# Patient Record
Sex: Female | Born: 1935 | Race: White | Hispanic: No | Smoking: Former smoker
Health system: Southern US, Community
[De-identification: ages and names within clinical notes are randomized; demographics above are authoritative.]

## PROBLEM LIST (undated history)

## (undated) DIAGNOSIS — R011 Cardiac murmur, unspecified: Secondary | ICD-10-CM

## (undated) DIAGNOSIS — K219 Gastro-esophageal reflux disease without esophagitis: Secondary | ICD-10-CM

## (undated) DIAGNOSIS — F329 Major depressive disorder, single episode, unspecified: Secondary | ICD-10-CM

## (undated) DIAGNOSIS — K859 Acute pancreatitis without necrosis or infection, unspecified: Secondary | ICD-10-CM

## (undated) DIAGNOSIS — M1990 Unspecified osteoarthritis, unspecified site: Secondary | ICD-10-CM

## (undated) DIAGNOSIS — I2510 Atherosclerotic heart disease of native coronary artery without angina pectoris: Secondary | ICD-10-CM

## (undated) DIAGNOSIS — R319 Hematuria, unspecified: Secondary | ICD-10-CM

## (undated) DIAGNOSIS — F32A Depression, unspecified: Secondary | ICD-10-CM

## (undated) DIAGNOSIS — G629 Polyneuropathy, unspecified: Secondary | ICD-10-CM

## (undated) DIAGNOSIS — R7303 Prediabetes: Secondary | ICD-10-CM

## (undated) DIAGNOSIS — R911 Solitary pulmonary nodule: Secondary | ICD-10-CM

## (undated) DIAGNOSIS — E785 Hyperlipidemia, unspecified: Secondary | ICD-10-CM

## (undated) DIAGNOSIS — I509 Heart failure, unspecified: Secondary | ICD-10-CM

## (undated) DIAGNOSIS — N289 Disorder of kidney and ureter, unspecified: Secondary | ICD-10-CM

## (undated) DIAGNOSIS — I10 Essential (primary) hypertension: Secondary | ICD-10-CM

## (undated) HISTORY — PX: ENDOSCOPIC RETROGRADE CHOLANGIOPANCREATOGRAPHY (ERCP) WITH PROPOFOL: SHX5810

## (undated) HISTORY — PX: CARDIAC CATHETERIZATION: SHX172

## (undated) HISTORY — PX: CORONARY ANGIOPLASTY: SHX604

## (undated) HISTORY — PX: HIP SURGERY: SHX245

## (undated) HISTORY — PX: OTHER SURGICAL HISTORY: SHX169

## (undated) HISTORY — PX: TOTAL KNEE ARTHROPLASTY: SHX125

## (undated) HISTORY — PX: BACK SURGERY: SHX140

## (undated) HISTORY — PX: ABDOMINAL SURGERY: SHX537

## (undated) HISTORY — PX: HIATAL HERNIA REPAIR: SHX195

## (undated) HISTORY — PX: CHOLECYSTECTOMY: SHX55

---

## 2007-04-22 ENCOUNTER — Ambulatory Visit: Payer: Self-pay | Admitting: Internal Medicine

## 2008-10-31 ENCOUNTER — Ambulatory Visit: Payer: Self-pay | Admitting: Internal Medicine

## 2010-10-15 ENCOUNTER — Ambulatory Visit: Payer: Self-pay | Admitting: Family Medicine

## 2011-03-23 ENCOUNTER — Emergency Department: Payer: Self-pay | Admitting: Emergency Medicine

## 2011-07-10 ENCOUNTER — Ambulatory Visit: Payer: Self-pay | Admitting: Family Medicine

## 2012-02-03 IMAGING — CT CT CHEST W/ CM
1 series · 15 of 31 positions shown, 19 images · IV contrast (isovue)
Comparison: none

REASON FOR EXAM: prev abnormal chest ct
COMMENTS:

PROCEDURE:     CT  - CT CHEST WITH CONTRAST  - July 10, 2011  [DATE]
RESULT:     Comparison: CT of the chest 03/23/2011
TECHNIQUE: Multiple axial images of the chest were obtained with 60 mL
Isovue 370 intravenous contrast.

[Series 2: soft tissue · axial · 0.68mm/px · z∈[+550,+856]mm · 15 of 67 slices shown, 19 images]
[im 3/67  mediastinal]
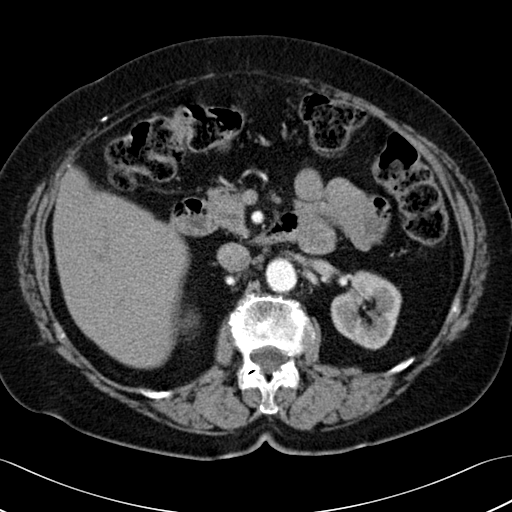
[im 3/67  lung]
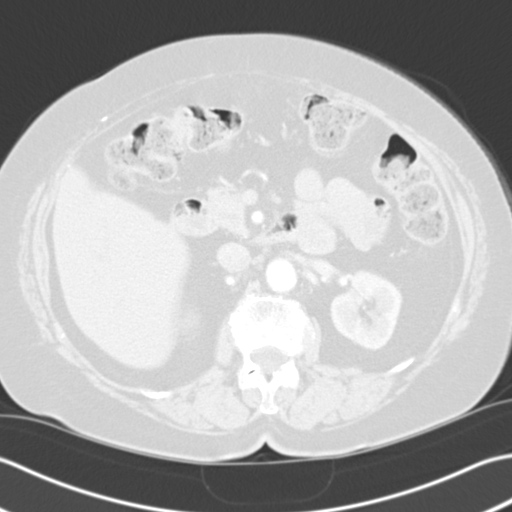
[im 8/67  lung]
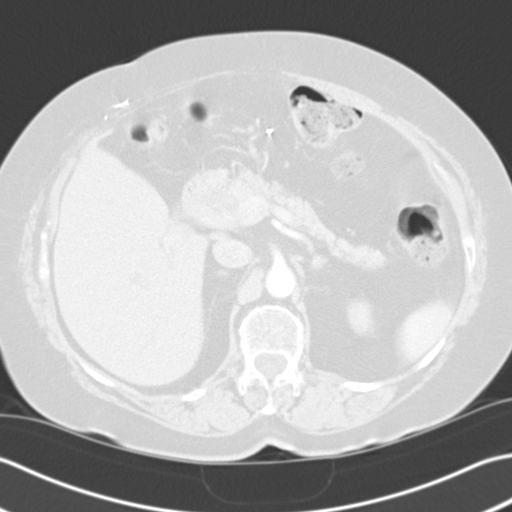
[im 13/67  lung]
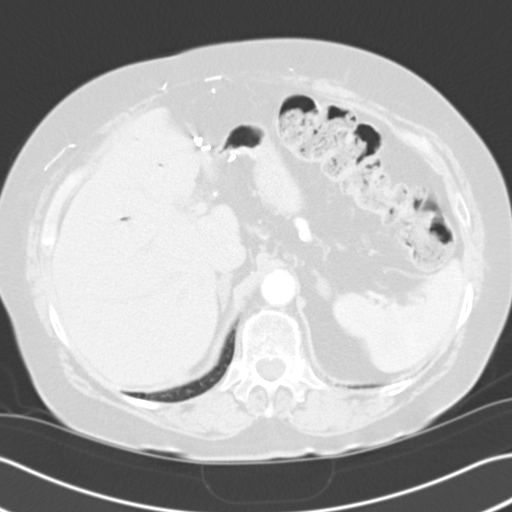
[im 15/67  lung]
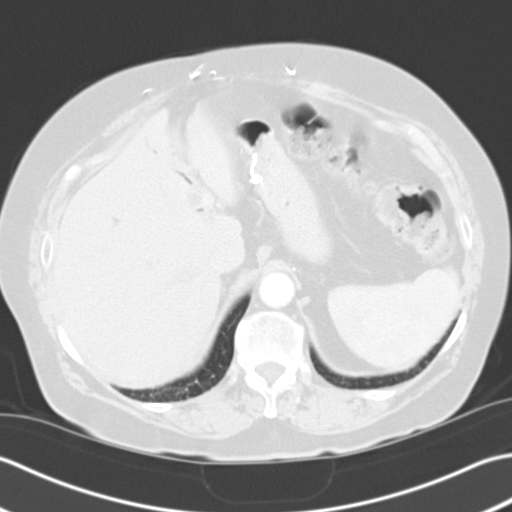
[im 20/67  mediastinal]
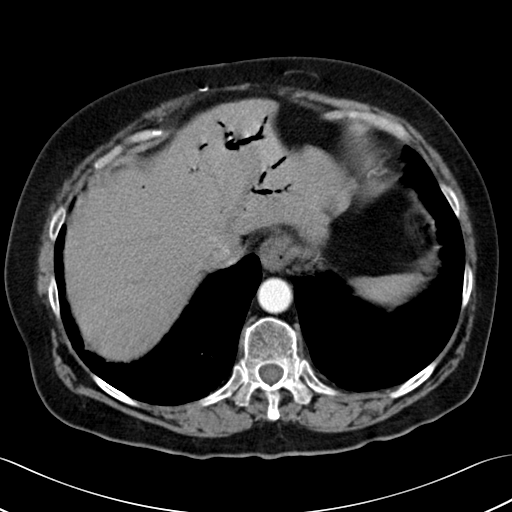
[im 20/67  lung]
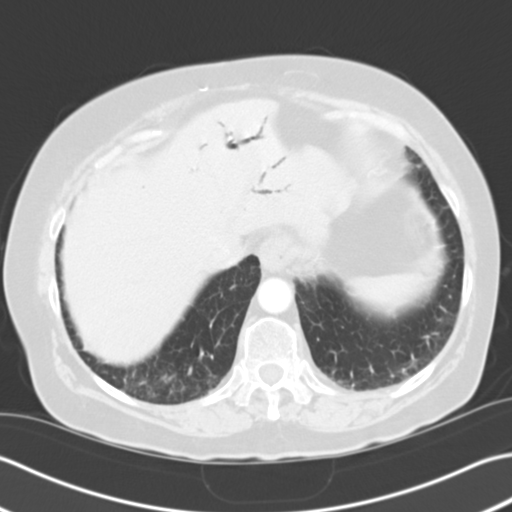
[im 25/67  lung]
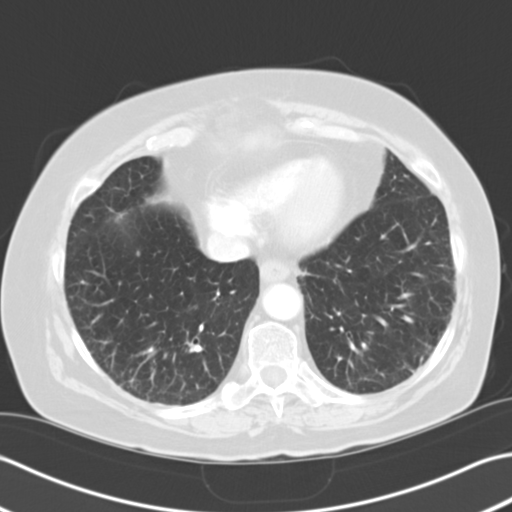
[im 30/67  lung]
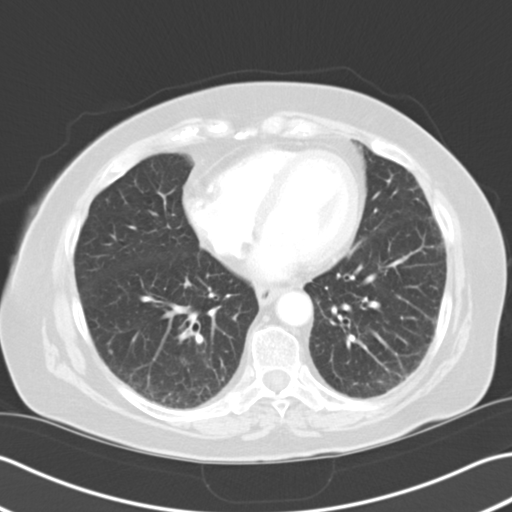
[im 35/67  lung]
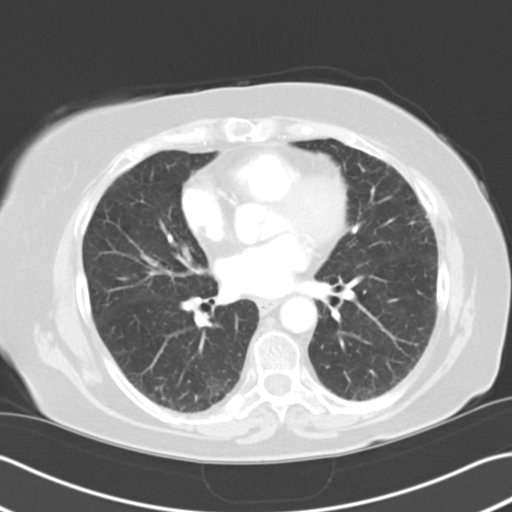
[im 37/67  mediastinal]
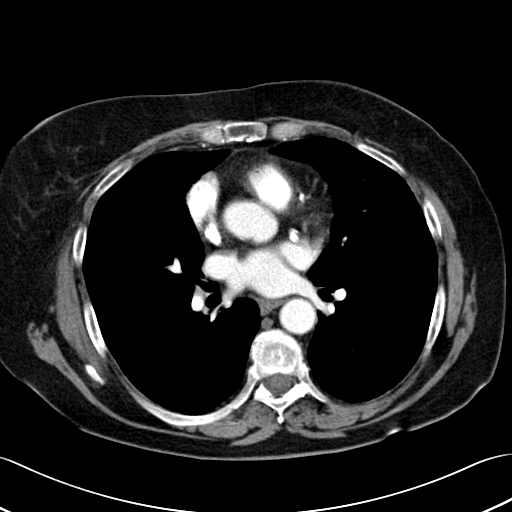
[im 37/67  lung]
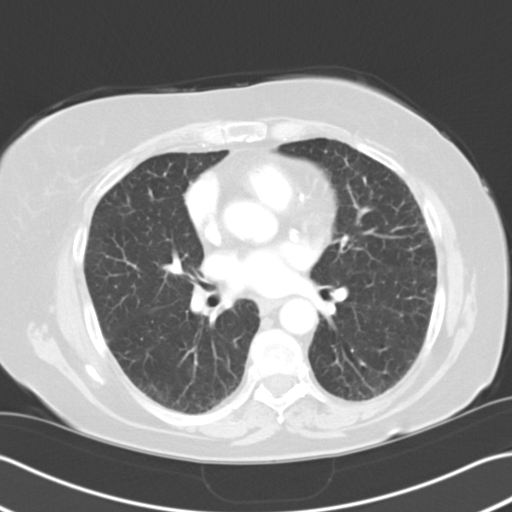
[im 42/67  lung]
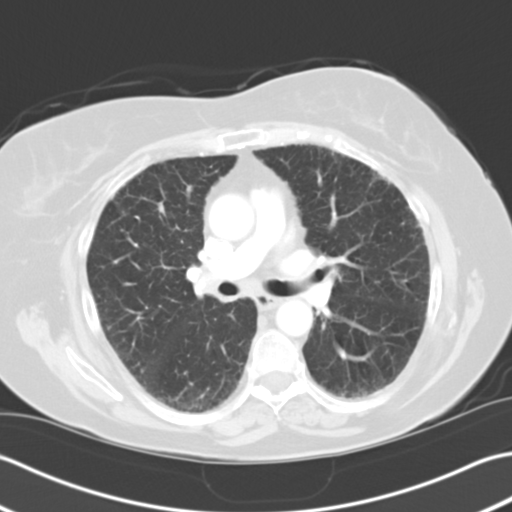
[im 47/67  lung]
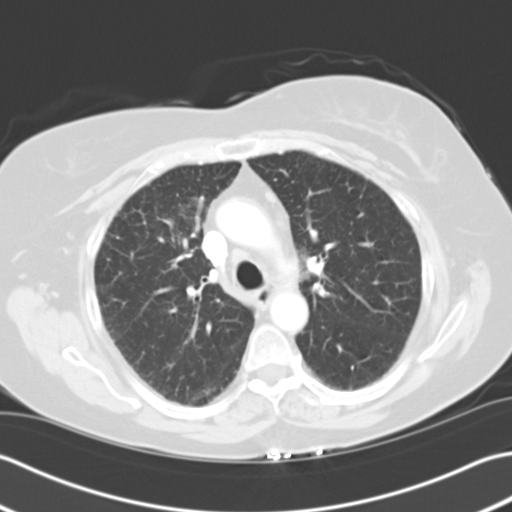
[im 52/67  lung]
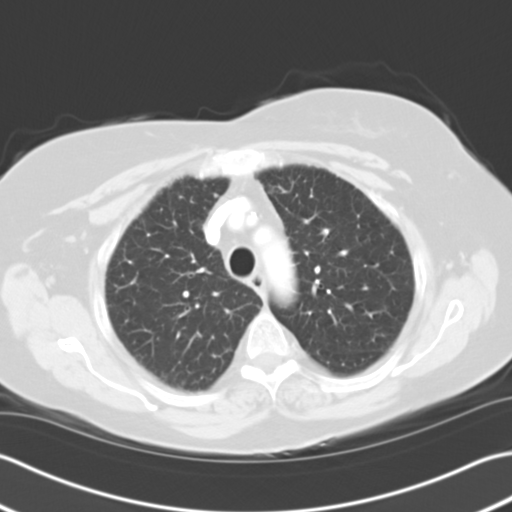
[im 54/67  mediastinal]
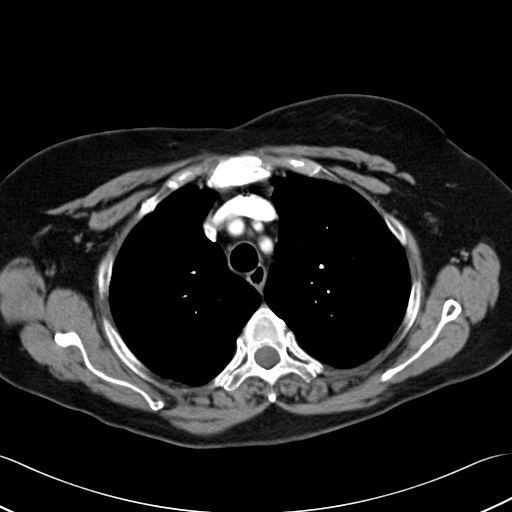
[im 54/67  lung]
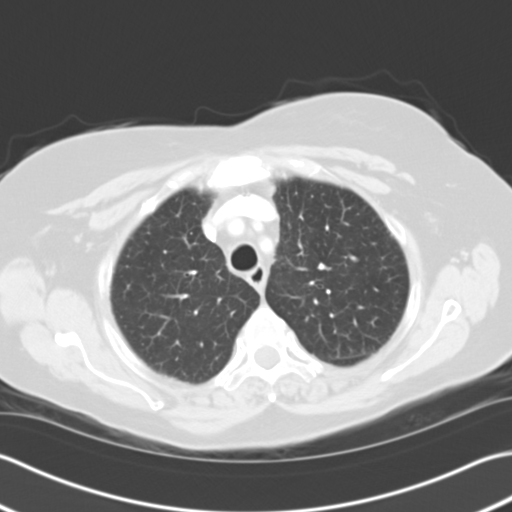
[im 59/67  lung]
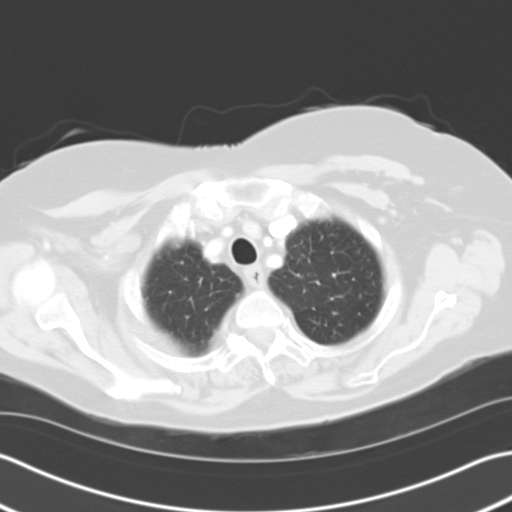
[im 64/67  lung]
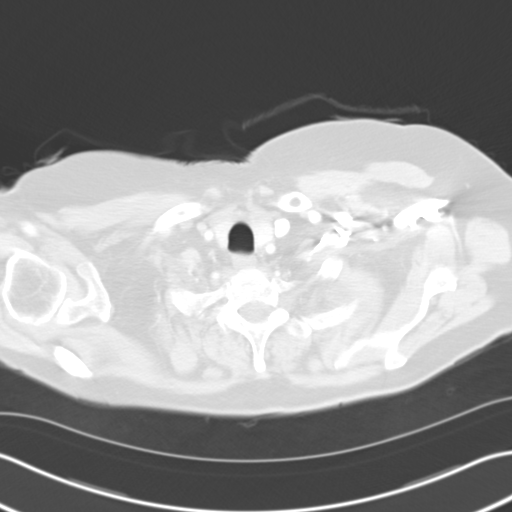

[15 of 31 positions shown; findings below may reference images not displayed]

FINDINGS: There is slight substernal extension of the left thyroid lobe. No
mediastinal, hilar, or axillary lymphadenopathy. Calcifications are seen in
the coronary arteries. There is pneumobilia in the left hepatic lobe and
common bile duct, likely related to prior sphincterotomy. The common bile
duct is mildly enlarged, similar to prior. Nodular thickening of the adrenal
glands is unchanged from prior studies, including the abdominal CT
10/15/2010.

The 8 mm nodule in the left upper lobe is unchanged from prior. Bilateral
subpleural interlobular septal thickening likely secondary to changes of
fibrosis. 4 mm nodule at the right lung base is unchanged from prior. A 4 mm
subpleural nodule in the right lower lobe is also unchanged on image 47.
Other 3-4 mm nodules are similar to prior.

No aggressive lytic or sclerotic osseous lesions identified.
IMPRESSION: 1. Unchanged 8 mm nodule in the left upper lobe. Followup noncontrast chest
CT is recommended in 3 months.
2. Other indeterminate subcentimeter pulmonary nodule are unchanged.
Recommend attention on followup chest CT.

## 2012-04-26 ENCOUNTER — Ambulatory Visit: Payer: Self-pay | Admitting: Emergency Medicine

## 2012-04-26 LAB — URINALYSIS, COMPLETE
Bilirubin,UR: NEGATIVE
Glucose,UR: NEGATIVE mg/dL (ref 0–75)
Ketone: NEGATIVE
Ph: 6.5 (ref 4.5–8.0)
Protein: NEGATIVE

## 2012-04-28 LAB — URINE CULTURE

## 2013-05-22 ENCOUNTER — Emergency Department: Payer: Self-pay | Admitting: Emergency Medicine

## 2014-12-21 ENCOUNTER — Observation Stay
Admit: 2014-12-21 | Disposition: A | Discharge status: Home / Self Care | Payer: Self-pay | Attending: Internal Medicine | Admitting: Internal Medicine

## 2014-12-21 LAB — URINALYSIS, COMPLETE
BACTERIA: NONE SEEN
Bilirubin,UR: NEGATIVE
Blood: NEGATIVE
GLUCOSE, UR: NEGATIVE mg/dL (ref 0–75)
Leukocyte Esterase: NEGATIVE
Nitrite: NEGATIVE
Ph: 6 (ref 4.5–8.0)
Protein: NEGATIVE
Specific Gravity: 1.017 (ref 1.003–1.030)

## 2014-12-21 LAB — COMPREHENSIVE METABOLIC PANEL
ALT: 10 U/L — AB
Albumin: 2.8 g/dL — ABNORMAL LOW
Alkaline Phosphatase: 66 U/L
Anion Gap: 7 (ref 7–16)
BUN: 14 mg/dL
Bilirubin,Total: 0.4 mg/dL
CHLORIDE: 108 mmol/L
CO2: 24 mmol/L
CREATININE: 0.68 mg/dL
Calcium, Total: 9.3 mg/dL
EGFR (Non-African Amer.): >60
GLUCOSE: 98 mg/dL
POTASSIUM: 3 mmol/L — AB
SGOT(AST): 22 U/L
SODIUM: 139 mmol/L
TOTAL PROTEIN: 5.7 g/dL — AB

## 2014-12-21 LAB — CBC
HCT: 33.4 % — ABNORMAL LOW (ref 35.0–47.0)
HGB: 11.1 g/dL — ABNORMAL LOW (ref 12.0–16.0)
MCH: 31.5 pg (ref 26.0–34.0)
MCHC: 33.3 g/dL (ref 32.0–36.0)
MCV: 95 fL (ref 80–100)
PLATELETS: 348 10*3/uL (ref 150–440)
RBC: 3.53 10*6/uL — AB (ref 3.80–5.20)
RDW: 15.1 % — AB (ref 11.5–14.5)
WBC: 8.8 10*3/uL (ref 3.6–11.0)

## 2014-12-21 LAB — LIPASE, BLOOD: Lipase: 49 U/L

## 2014-12-21 LAB — TROPONIN I: TROPONIN-I: 0.06 ng/mL — AB

## 2014-12-22 ENCOUNTER — Other Ambulatory Visit: Payer: Self-pay | Admitting: Physician Assistant

## 2014-12-22 DIAGNOSIS — I340 Nonrheumatic mitral (valve) insufficiency: Secondary | ICD-10-CM | POA: Diagnosis not present

## 2014-12-22 DIAGNOSIS — R112 Nausea with vomiting, unspecified: Secondary | ICD-10-CM | POA: Diagnosis not present

## 2014-12-22 DIAGNOSIS — R079 Chest pain, unspecified: Secondary | ICD-10-CM | POA: Diagnosis not present

## 2014-12-22 LAB — BASIC METABOLIC PANEL
Anion Gap: 2 — ABNORMAL LOW (ref 7–16)
BUN: 13 mg/dL
CO2: 25 mmol/L
Calcium, Total: 8.7 mg/dL — ABNORMAL LOW
Chloride: 112 mmol/L — ABNORMAL HIGH
Creatinine: 0.62 mg/dL
EGFR (African American): >60
GLUCOSE: 89 mg/dL
Potassium: 3.4 mmol/L — ABNORMAL LOW
Sodium: 139 mmol/L

## 2014-12-22 LAB — CBC WITH DIFFERENTIAL/PLATELET
Basophil #: 0 10*3/uL (ref 0.0–0.1)
Basophil %: 0.7 %
EOS ABS: 0.2 10*3/uL (ref 0.0–0.7)
EOS PCT: 2.5 %
HCT: 32.6 % — AB (ref 35.0–47.0)
HGB: 10.5 g/dL — AB (ref 12.0–16.0)
LYMPHS ABS: 2.5 10*3/uL (ref 1.0–3.6)
Lymphocyte %: 36 %
MCH: 30.9 pg (ref 26.0–34.0)
MCHC: 32.1 g/dL (ref 32.0–36.0)
MCV: 96 fL (ref 80–100)
MONO ABS: 0.7 x10 3/mm (ref 0.2–0.9)
Monocyte %: 10.2 %
NEUTROS PCT: 50.6 %
Neutrophil #: 3.5 10*3/uL (ref 1.4–6.5)
PLATELETS: 307 10*3/uL (ref 150–440)
RBC: 3.39 10*6/uL — AB (ref 3.80–5.20)
RDW: 15.2 % — AB (ref 11.5–14.5)
WBC: 6.9 10*3/uL (ref 3.6–11.0)

## 2014-12-22 LAB — LIPID PANEL
Cholesterol: 106 mg/dL
HDL Cholesterol: 33 mg/dL — ABNORMAL LOW
Ldl Cholesterol, Calc: 52 mg/dL
Triglycerides: 106 mg/dL
VLDL Cholesterol, Calc: 21 mg/dL

## 2014-12-22 LAB — MAGNESIUM: Magnesium: 1.8 mg/dL

## 2014-12-22 LAB — TSH: THYROID STIMULATING HORM: 1.016 u[IU]/mL

## 2014-12-22 LAB — TROPONIN I
TROPONIN-I: 0.05 ng/mL — AB
TROPONIN-I: 0.06 ng/mL — AB
Troponin-I: 0.04 ng/mL — ABNORMAL HIGH

## 2015-01-10 ENCOUNTER — Encounter: Payer: Self-pay | Admitting: Cardiovascular Disease

## 2015-01-14 NOTE — H&P (Signed)
PATIENT NAME:  Egbert GaribaldiHUSKETH, Ewelina MR#:  [id number] DATE OF BIRTH:  20-Nov-1926  DATE OF ADMISSION:  12/21/2014  REFERRING PHYSICIAN:  Cecille AmsterdamJonathan E. Mayford KnifeWilliams, MD  PRIMARY CARE PHYSICIAN:  [facility];  CHIEF COMPLAINT:  Nausea, vomiting, weakness.   HISTORY OF PRESENT ILLNESS:  An 81 year old Caucasian female with past medical history of chronic pancreatitis and COPD, non-O2 dependent, presenting with nausea, vomiting, and worsening weakness. She describes intermittent nausea and vomiting for about 2 months in total duration, worsening recently, described as nonbloody, nonbilious emesis. Denies abdominal pain, however, has poor p.o. intake and because of this is suffering with progressive weakness. Her symptoms were somewhat complicated whenever she fell and had a femur fracture about a month ago, which now she is in a rehabilitation facility. She denies any further symptoms other than generalized weakness, which has been progressive, and continuous nausea and vomiting slightly worsened.   REVIEW OF SYSTEMS: CONSTITUTIONAL:  Denies fevers or chills. Positive for fatigue and weakness.  EYES:  Denies blurred vision, double vision, or eye pain.  EARS, NOSE, AND THROAT:  Denies tinnitus, ear pain, or hearing loss.  RESPIRATORY:  Denies cough, wheeze, or shortness of breath.  CARDIOVASCULAR:  Denies chest pain, palpitations, or edema.  GASTROINTESTINAL:  Positive for nausea and vomiting as described above. Denies any diarrhea, constipation, or abdominal pain.  GENITOURINARY:  Denies dysuria or hematuria.  ENDOCRINE:  Denies nocturia or thyroid problems.  HEMATOLOGIC AND LYMPHATIC:  Denies easy bruising or bleeding.  SKIN:  Denies rashes or lesions.  MUSCULOSKELETAL:  Denies pain in the neck, back, shoulders, knees, or hips or arthritic symptoms.  NEUROLOGIC:  Denies paralysis or paresthesias.  PSYCHIATRIC:  Denies anxiety or depressive symptoms.   Otherwise, full review of systems performed  by me is negative.   PAST MEDICAL HISTORY:  Includes COPD, unspecified, non-O2 dependent; history of chronic pancreatitis.   SOCIAL HISTORY:  Uses a walker for ambulation. Currently in a rehabilitation facility. No alcohol, tobacco, or drug usage.   FAMILY HISTORY:  No known cardiovascular or pulmonary disorders.   ALLERGIES:  ATORVASTATIN, BETA BLOCKADES AS WELL AS CODEINE.   HOME MEDICATIONS:  Include oxycodone 5 mg p.o. q. 4 hours as needed for pain; Tylenol 325 mg p.o. 3 times a day as needed for pain or fever; lisinopril 10 mg p.o. daily; Nitrostat 0.4 mg sublingually daily as needed for chest pain; Lovenox 40 mg subcutaneously daily; fluoxetine 40 mg p.o. daily; trazodone 50 mg p.o. at bedtime as needed for sleep; promethazine 25 mg p.o. every 6 hours as needed for nausea and vomiting; pravastatin 40 mg p.o. at bedtime; alendronate 70 mg p.o. weekly on Sundays; Creon 6000, 19,000, and 30,000 units 3 capsules 3 times daily with meals; bisacodyl 5 mg 2 tablets daily as needed for constipation; MiraLax 17 grams daily; Senokot-S 50/8.6 mg 2 tabs p.o. b.i.d.; pantoprazole 40 mg p.o. daily; multivitamin 1 tablet p.o. daily; vitamin D2 at 50,000 international units p.o. q. weekly.   PHYSICAL EXAMINATION: VITAL SIGNS:  Temperature 99.2, heart rate 75, respirations 16, blood pressure 120/67, saturation 100% on room air. Weight is 69.9 kg, BMI 24.9.  GENERAL:  Chronically ill, frail-appearing Caucasian female currently in no acute distress.  HEAD:  Normocephalic, atraumatic.  EYES:  Pupils are equal, round, and reactive to light. Extraocular muscles are intact. No scleral icterus.  MOUTH:  Dry mucosal membrane. Dentition is poor. No abscess noted.  EARS, NOSE, AND THROAT:  Clear without exudates. No external lesions.  NECK:  Supple. No thyromegaly. No nodules. No JVD.  PULMONARY:  Clear to auscultation bilaterally without wheezes, rales, or rhonchi. No use of accessory muscles. Good respiratory  effort. CHEST:  Nontender to palpation.  CARDIOVASCULAR:  S1 and S2, regular rate and rhythm. No murmurs, rubs, or gallops. No edema. Pedal pulses are 2+ bilaterally.  GASTROINTESTINAL:  Soft, nontender, nondistended. No masses. Positive bowel sounds. No hepatosplenomegaly.  MUSCULOSKELETAL:  No swelling, clubbing, or edema. Range of motion is full in all extremities.  NEUROLOGIC:  Cranial nerves II through XII are intact. No gross focal neurological deficits. Sensation is intact. Reflexes are intact.  SKIN:  No ulceration, lesions, rashes, or cyanosis. Skin is warm and dry. Turgor is intact.  PSYCHIATRIC:  Mood and affect are within normal limits. The patient is awake, alert, and oriented x 3. Insight and judgment are intact.   LABORATORY DATA:  Sodium is 139, potassium 3, chloride 108, bicarbonate 24, BUN 14, creatinine 0.68, and glucose is 98. LFTs:  Total protein 5.7, albumin 2.8, ALT 10, otherwise LFTs within normal limits. Troponin is 0.06. WBC is 8.8, hemoglobin 11.4, and platelets are 348,000. Urinalysis is negative for evidence of infection. Chest x-ray performed, which reveals no acute cardiopulmonary process.   ASSESSMENT AND PLAN:  An 81 year old Caucasian female with history of chronic pancreatitis, presenting with nausea, vomiting, and weakness.   1.  Weakness, generalized. We will get physical therapy evaluation and provide IV fluid hydration.  2.  Elevated troponin without symptoms. Initiate aspirin and statin therapy. Place on telemetry. Trend cardiac enzymes x 3. She is on a prophylactic dose of Lovenox daily. If there is a significant upward trend in enzymes, we will initiate therapeutic dosing.  3.  Hypokalemia. Replace potassium, goal 4 to 5.  4.  Hypertension, essential. Continue with lisinopril.  5.  Venous thromboembolism prophylaxis with Lovenox.   CODE STATUS:  The patient is a full code.   TIME SPENT:  35 minutes.    ____________________________ Cletis Athens. Hower,  MD dkh:nb D: 12/21/2014 22:38:27 ET T: 12/21/2014 23:10:12 ET JOB#: [id number]  cc: Cletis Athens. Hower, MD, <Dictator> DAVID Synetta Shadow MD ELECTRONICALLY SIGNED 12/22/2014 3:53

## 2015-01-14 NOTE — Discharge Summary (Signed)
PATIENT NAME:  Tamara Rich, Tamara Rich MR#:  [id number] DATE OF BIRTH:  27-Jul-1927  DATE OF ADMISSION:  12/21/2014 DATE OF DISCHARGE:  12/22/2014  ADMITTING DIAGNOSES: Nausea and vomiting.   DISCHARGE DIAGNOSES: 1.  Intermittent nausea and vomiting, unclear etiology, rule out gastroparesis. 2.  Weight loss.  3.  Elevated troponin with normal echocardiogram. 4.  Hypokalemia.  5.  Generalized weakness.  6.  Right lower extremity pain after right femur fracture in February 2016.  7.  Intermittent chest pain.  8.  History of chronic obstructive pulmonary disease. 9.  Chronic pancreatitis.   DISCHARGE CONDITION: Stable.   DISCHARGE MEDICATIONS: The patient is to continue: 1.   Pravachol 40 mg p.o. at bedtime. 2.  Vitamin D2 50,000 units once weekly. 3.  Tylenol 325 mg 2 capsules 3 times daily as needed. 4.  Promethazine 25 mg every 6 hours as needed.  5.  Trazodone 50 mg p.o. at bedtime as needed. 6.  Oxycodone 5 mg every 4 hours as needed.  7.  Fluticasone nasal spray 2 sprays once daily in the morning.  8.  Nitrostat 0.4 mg sublingual every 5 minutes as needed.  9.  Alendronate 70 mg p.o. weekly. 10.  MiraLax 17 grams once daily. 11.  Pantoprazole 40 mg p.o. daily. 12.  Fluoxetine 40 mg p.o. daily. 13.  Lovenox 40 mg injectable solution inject once daily as previously ordered. 14.  Multivitamins once daily. 15.  Lidoderm 5% topical film once daily. 16.  Senokot S 50/8.6 two tablets twice daily. 17.  Creon 3 capsules 3 times daily with meals. 18.  Bisacodyl 5 mg 2 tablets once daily as needed.  19.  Lisinopril 5 mg p.o. daily. 20.  Promethazine 25 mg 3 times daily around the clock. 21.  Aspirin 81 mg p.o. daily; this is new medication.  22.  Magnesium oxide 400 mg p.o. daily. 23.  Tums 500 mg 2 to 3 times daily. 24.  Remeron 7.5 mg once at bedtime.   HOME OXYGEN: None.   DIET: 2 grams salt, low-fat, low-cholesterol, Boost Breeze 240 mL variety 3 times daily as tolerated. Diet  consistency: Mechanical soft. Advance to regular as tolerated.   ACTIVITY LIMITATIONS: As tolerated.   REFERRALS: To physical therapy.  DISCHARGE FOLLOWUP: 1.  Followup appointment with [facility], GI, in 2 days after discharge for intermittent nausea and vomiting as well as weight loss and possibly gastroparesis evaluation if EGD is done in the past with no significant abnormalities.  2.  [place] [facility] in 2 days after discharge for mildly elevated troponin. 3.  Followup with Dr. Jaci Lazier in 2 days after discharge.  CONSULTANTS: Care management, social work.   RADIOLOGIC STUDIES: Echocardiogram 8th of April 2016 revealing essentially normal study. Right ventricular ejection fraction by visual estimation 55% to 60%, normal global left ventricular systolic function, normal right ventricular size and systolic function, mild left ventricular hypertrophy, mild mitral valve regurgitation, mild tricuspid regurgitation, normal pulmonary arterial pressures.  Chest x-ray, portable single view, 7th of April 2016, showed no active disease.   HOSPITAL COURSE: The patient is an 81 year old Caucasian female with history of pancreatic insufficiency who presented to the hospital with complaints of nausea and vomiting as well as generalized weakness and weight loss. Please refer to Dr. Jarrett Soho admission note on the 7th of April 2016.   On arrival to the hospital, the patient's vital signs: Temperature was 99.2, pulse was 75, respiration rate 16, blood pressure 120/67, and saturation was 100% on room  air. Physical examination was unremarkable. The patient's oral mucosa was dry.   The patient's lab data done on arrival to the hospital showed low potassium level at 3, otherwise unremarkable study. Lipase level was normal at 49. The patient's liver enzymes revealed albumin level of 2.8, otherwise normal. Troponin was elevated at 0.6 on the first set, 0.06 on the second set, 0.05 on the third  set and 0.04 on the fourth. MB fraction as well as CK total were not checked. TSH was normal at 1.016. The patient's white blood cell count was normal at 8.8, hemoglobin was 11.1, and platelet count was 348,000. Urinalysis was unremarkable.   The patient was admitted to the hospital for further evaluation. She was hydrated. Her magnesium was checked and was found to be normal at 1.8. Her lipid panel was also checked and LDL was found to be 52, cholesterol level was 106, triglycerides 106, and HDL was 33. The patient was continued on antiemetics and her condition improved. She was able to eat for breakfast and documented 100% of food intake. She was not able to eat much for lunch, however. I discussed her case with dietary and dietary is to recommend Ensure Clear or Boost Breeze 3 times daily if the patient is able to tolerate. She was advised [facility] and discuss case with them and make decisions about further investigations including gastroparesis evaluation if needed provided EGDs were done in the past and no other pathology is found. The patient is to continue also nutritional supplements if tolerated.   In regards to elevated troponin, the patient had an echocardiogram done which was completely within normal limits. No wall motion abnormalities were found. It was felt the patient's elevated troponin very likely is demand ischemia and no further recommendations were made. However, the patient was recommended to start on aspirin therapy. No beta blockers were initiated due to relatively bradycardic heart rate. The patient is to follow up with cardiologist as outpatient and make decisions about further evaluation if needed.   In regards to hypokalemia, the patient's potassium level was supplemented. Magnesium level was normal at 1.8. Magnesium level was borderline normal and for this reason magnesium supplementation was recommended.   In regards to  generalized weakness, the patient is to continue physical therapy.   For her right lower extremity pains, due to right femur fracture in the past, the patient is to continue pain management.   For her chronic medical problems such as COPD and chronic pancreatitis she is to continue her outpatient medications. No changes were made.  DISCUSSION: The patient's intermittent nausea and vomiting could be caused by her medications including fluoxetine as well as trazodone combined. It would be beneficial to slowly taper these medications off. I am introducing low dose of Remeron to improve her appetite. The patient may benefit from Megace or dronabinol to improve her appetite. The patient would also benefit from calcium supplementation to improve her right femur healing.  The patient is being discharged in stable condition with the above-mentioned medications and follow-up. I discussed the patient's case with her daughter quite extensively. The patient's vital signs on the day of discharge: Temperature was 98.3, pulse was 58, respiratory rate was 16, blood pressure ranging from 100 systolic to 123 and 60s to 70s diastolic, and O2 sats were 96% to 98% on room air at rest.   TIME SPENT: 40 minutes.  ____________________________ Katharina Caperima Mario Coronado, MD rv:sb D: 12/22/2014 14:37:05 ET T:  12/22/2014 15:17:38 ET JOB#: [id number]  cc: Katharina Caper, MD, <Dictator> Dr. [facility]  Kameron Glazebrook MD ELECTRONICALLY SIGNED 12/24/2014 16:40

## 2015-01-14 NOTE — Consult Note (Signed)
General Aspect Primary Cardiologist: [facility] ______________  81 year old female with history of CAD s/p remote stenting to RCA and LCx in 2000, chronic pancreatitis, chronic abdominal pain, COPD not on home O2, previous left hip fracture, recent femur fracture, HTN, HLD, palpitations, bradycardia, depression, and peripheral neuropathy who preented to [facility] on 12/21/2014 with a 2 month history of intermittent nausea and vomiting after recently being admitted to [facility] 3/22-3/30 for the same in the setting of UTI at that time. Cardiology is consulted for elevated troponin peak of 0.06. _____________  PMH: 1. CAD s/p remote stenting to RCA and LCx in 2000 2. Chronic pancreatitis 3. Chronic abdominal pain 4. COPD not on home O2 5. Previous left hip fracture 6. Recent femur fracture 7. HTN 8. HLD 9. Palpitations 10. Bradycardia 11. Depression 12. Peripheral neuropathy 13. Choledocholithiasis s/p stone extraction via ERCP 09/2014 _________________________   Present Illness 81 year old female with the above complex problem list who presented to [facility] on 4/7 with 2 month history of intermittent nausea and vomiting after recent admission to [facility] for the same on 3/22-3/30.   She has known CAD s/p PCI to LCx and RCA in 2000.  Follow up cardiac cath in 03/2010 secondary to atypical chest pain that showed LM normal. Prox LAD 20%, mid LAD 70%, distal LAD 40%, LCx and mid LCx 50%, distal LCx 20%, third marginal 30%, mRCA 30%, dRCA 20%. Based on atypical chest pain, negative recent nuclear study patient defined as having borderline disease without really any significant disease. She was treated medically. In 12/25/2010 she had an episode of chest heaviness, jaw and neck pain. She underwent pharmacologic nuclear stress on 01/08/2011 that showed ischemia noted, base inferior, base inferolateral, mid anteroseptal, mid inferior, mid inferolateral, apical anterior and apical inferior walls. Basaed on this she underwent  cardiac catheterization on 01/14/2011 that showed nonobstructive coronary artery disease with pRCA 30%, dRCA 30%, mLCx 40%, OM2 30%, pLAD 20%, mLAD 30%, D2 50%. She again complained of intermittent chest pain in 02/2012 and underwent cardiac cath that showed LAD 20%, D2 70%. LCX 20%. RCA 30%. C/b right groin hematoma.   She has had "abdominal issues" since she was a child. She mentions a longstanding history of vomiting her whole life. She has been followed by [facility] for this and has been placed on PPI for at least the past 5 years. [facility] has told her they have done everything for her and she must now treat her symptoms. Her daughter continues to want her to be "fixed." Unfortunately, she refuses to take this at times. She has poor PO intake. She states she is ready to die. Her daughters are not ready for this and want her to do everything she can to stay with them.   She was recently admitted to [facility] 3/22-3/30 with Enterococcus UTI, nausea, and vomiting. She was hydrated and started on stool softener for constipation which has helped to the point of diarrhea. She was treated with IV vancomycin for her infection. She continues to exhibit the same symptoms as her previous admission, though improved.   She reports an intermittent history of nausea and vomiting for the past 2 months. She denies any chest pain during this time, during this admission, or currently. She has not had any SOB, palpitaitons, diaphoresis, presyncope, or syncope. Her nausea and vomiting have improved. Her appetite has improved. She reports that she feels better. Her troponin showed 0.06-->0.06-->0.05-->0.04. CXR negative. TSH normal. Albumin 2.8. Echo showed EF 55-60%, mild  MR/TR, normal PASP.   Physical Exam:  GEN well developed, no acute distress   HEENT PERRL, hearing intact to voice, moist oral mucosa   NECK supple  no JVD   RESP normal resp effort  clear BS   CARD Regular rate and rhythm  Murmur   Murmur Systolic  1/6 apex    ABD denies tenderness  soft   LYMPH negative neck   EXTR negative edema   SKIN normal to palpation   NEURO cranial nerves intact, motor/sensory function intact   PSYCH alert, A+O to time, place, person, good insight   Review of Systems:  Subjective/Chief Complaint Nausea, resolved, anorexia   General: Fatigue  Weakness   Skin: No Complaints   ENT: No Complaints   Eyes: No Complaints   Neck: No Complaints   Respiratory: No Complaints   Cardiovascular: No Complaints   Gastrointestinal: Heartburn  Nausea   Genitourinary: No Complaints   Vascular: No Complaints   Musculoskeletal: No Complaints   Neurologic: No Complaints   Hematologic: No Complaints   Endocrine: No Complaints   Psychiatric: No Complaints   Review of Systems: All other systems were reviewed and found to be negative   Medications/Allergies Reviewed Medications/Allergies reviewed   Family & Social History:  Family and Social History:  Family History Coronary Artery Disease  Hypertension  Diabetes Mellitus  COPD  Cancer   Social History positive  tobacco, negative ETOH, negative Illicit drugs   + Tobacco Prior (greater than 1 year)  1/4 ppd for 40 years quit 2010   Place of Living Home     cad s/p stenting:    Kidney Cancer:    copd:    Left Hip Replacement:    Bilateral Knee Replacement:    hysterectomy:    cholecystectomy:   Home Medications: Medication Instructions Status  Remeron 15 mg oral tablet 0.5 tab(s) orally once a day (at bedtime) Active  promethazine 25 mg oral tablet 1 tab(s) orally 3 times a day Active  Tums 500 mg oral tablet, chewable 1 tab(s) orally 3 times a day Active  aspirin 81 mg oral delayed release tablet 1 tab(s) orally once a day Active  lisinopril 5 mg oral tablet 1 tab(s) orally once a day Active  magnesium oxide 400 mg (241.3 mg elemental magnesium) oral tablet 1 tab(s) orally once a day Active  pravastatin 40 mg oral tablet 1 tab(s) orally  once a day (at bedtime) Active  Vitamin D2 50,000 intl units oral capsule 1 cap(s) orally once a week (on Saturday) Active  Tylenol 325 mg oral tablet 3 tab(s) orally 3 times a day, As Needed - for Pain, for Fever  Active  promethazine 25 mg oral tablet 1 tab(s) orally every 6 hours, As Needed - for Nausea, Vomiting Active  traZODone 50 mg oral tablet 1 tab(s) orally once a day (at bedtime), As Needed - for Inability to Sleep Active  oxyCODONE 5 mg oral tablet 1 tab(s) orally every 4 hours, As Needed - for Pain Active  fluticasone nasal 50 mcg/inh nasal spray 2 spray(s) nasal once a day (in the morning) Active  Nitrostat 0.4 mg sublingual tablet 1 tab(s) sublingual every 5 minutes, As Needed - for Chest Pain Active  alendronate 70 mg oral tablet 1 tab(s) orally once a week (on Sunday) Active  MiraLax - oral powder for reconstitution 17 gram(s) orally once a day (in the morning) Active  pantoprazole 40 mg oral delayed release tablet 1 tab(s) orally once a day (  in the morning) Active  FLUoxetine 40 mg oral capsule 1 cap(s) orally once a day (in the morning) Active  Lovenox 40 mg/0.4 mL injectable solution 0.4 milliliter(s) injectable once a day (in the morning) Active  Therems Therapeutic Multiple Vitamins oral tablet 1 tab(s) orally once a day (in the morning) Active  Lidoderm 5% topical film Apply topically to affected area once a day (in the morning) Active  Senokot S 50 mg-8.6 mg oral tablet 2 tab(s) orally 2 times a day Active  Creon 6000 units-19,000 units-30,000 units oral delayed release capsule 3 cap(s) orally 3 times a day (with meals) Active  bisacodyl 5 mg oral delayed release tablet 2 tab(s) orally once a day, As Needed - for Constipation Active   Lab Results:  Hepatic:  07-Apr-16 20:02   Albumin, Serum  2.8 (3.5-5.0 NOTE: New reference range  11/21/14)  Routine Chem:  08-Apr-16 05:13   Result Comment - TROPONIN  - PREVIOUSLY CALLED  - 12-21-14 @2100  BY [facility]...AJO  Result(s)  reported on 22 Dec 2014 at 06:14AM.  Glucose, Serum 89 (65-99 NOTE: New Reference Range  11/21/14)  BUN 13 (6-20 NOTE: New Reference Range  11/21/14)  Creatinine (comp) 0.62 (0.44-1.00 NOTE: New Reference Range  11/21/14)  Sodium, Serum 139 (135-145 NOTE: New Reference Range  11/21/14)  Potassium, Serum  3.4 (3.5-5.1 NOTE: New Reference Range  11/21/14)  Chloride, Serum  112 (101-111 NOTE: New Reference Range  11/21/14)  CO2, Serum 25 (22-32 NOTE: New Reference Range  11/21/14)  Calcium (Total), Serum  8.7 (8.9-10.3 NOTE: New Reference Range  11/21/14)  Anion Gap  2  eGFR (African American) >60  eGFR (Non-African American) >60 (eGFR values <28m/min/1.73 m2 may be an indication of chronic kidney disease (CKD). Calculated eGFR is useful in patients with stable renal function. The eGFR calculation will not be reliable in acutely ill patients when serum creatinine is changing rapidly. It is not useful in patients on dialysis. The eGFR calculation may not be applicable to patients at the low and high extremes of body sizes, pregnant women, and vegetarians.)    05:34   Magnesium, Serum 1.8 (1.7-2.4 THERAPEUTIC RANGE: 4-7 mg/dL TOXIC: > 10 mg/dL  ----------------------- NOTE: New Reference Range  11/21/14)  Cardiac:  07-Apr-16 20:02   Troponin I  0.06 (0.00-0.03 0.03 ng/mL or less: NEGATIVE  Repeat testing in 3-6 hrs  if clinically indicated. >0.05 ng/mL: POTENTIAL  MYOCARDIAL INJURY. Repeat  testing in 3-6 hrs if  clinically indicated. NOTE: An increase or decrease  of 30% or more on serial  testing suggests a  clinically important change NOTE: New Reference Range  11/21/14)  08-Apr-16 00:15   Troponin I  0.06 (0.00-0.03 0.03 ng/mL or less: NEGATIVE  Repeat testing in 3-6 hrs  if clinically indicated. >0.05 ng/mL: POTENTIAL  MYOCARDIAL INJURY. Repeat  testing in 3-6 hrs if  clinically indicated. NOTE: An increase or decrease  of 30% or more on serial   testing suggests a  clinically important change NOTE: New Reference Range  11/21/14)    05:13   Troponin I  0.05 (0.00-0.03 0.03 ng/mL or less: NEGATIVE  Repeat testing in 3-6 hrs  if clinically indicated. >0.05 ng/mL: POTENTIAL  MYOCARDIAL INJURY. Repeat  testing in 3-6 hrs if  clinically indicated. NOTE: An increase or decrease  of 30% or more on serial  testing suggests a  clinically important change NOTE: New Reference Range  11/21/14)    08:45   Troponin I  0.04 (0.00-0.03 0.03  ng/mL or less: NEGATIVE  Repeat testing in 3-6 hrs  if clinically indicated. >0.05 ng/mL: POTENTIAL  MYOCARDIAL INJURY. Repeat  testing in 3-6 hrs if  clinically indicated. NOTE: An increase or decrease  of 30% or more on serial  testing suggests a  clinically important change NOTE: New Reference Range  11/21/14)  Routine UA:  07-Apr-16 21:52   Color (UA) Yellow  Clarity (UA) Clear  Glucose (UA) Negative  Bilirubin (UA) Negative  Ketones (UA) Trace  Specific Gravity (UA) 1.017  Blood (UA) Negative  pH (UA) 6.0  Protein (UA) Negative  Nitrite (UA) Negative  Leukocyte Esterase (UA) Negative (Result(s) reported on 21 Dec 2014 at 10:22PM.)  RBC (UA) 0-5  WBC (UA) 0-5  Bacteria (UA) NONE SEEN  Epithelial Cells (UA) 0-5  Mucous (UA) PRESENT (Result(s) reported on 21 Dec 2014 at 10:22PM.)  Routine Hem:  08-Apr-16 05:13   WBC (CBC) 6.9  RBC (CBC)  3.39  Hemoglobin (CBC)  10.5  Hematocrit (CBC)  32.6  Platelet Count (CBC) 307  MCV 96  MCH 30.9  MCHC 32.1  RDW  15.2  Neutrophil % 50.6  Lymphocyte % 36.0  Monocyte % 10.2  Eosinophil % 2.5  Basophil % 0.7  Neutrophil # 3.5  Lymphocyte # 2.5  Monocyte # 0.7  Eosinophil # 0.2  Basophil # 0.0 (Result(s) reported on 22 Dec 2014 at 05:49AM.)   EKG:  EKG Interp. by me   Interpretation EKG shows NSR, 76 bpm, left anterior fascicular block, left axis deviation, TWI I and V6   Radiology Results:  XRay:    07-Apr-16 21:35, Chest  Portable Single View  Chest Portable Single View   REASON FOR EXAM:    chest pain, cough  COMMENTS:       PROCEDURE: DXR - DXR PORTABLE CHEST SINGLE VIEW  - Dec 21 2014  9:35PM     CLINICAL DATA:  Nausea, vomiting, diarrhea, cough, and fever today.  History of COPD.    EXAM:  PORTABLE CHEST - 1 VIEW    COMPARISON:  CT chest 07/10/2011.  Chest 03/23/2011.    FINDINGS:  Mild diffuse interstitial pattern to the lungs suggesting fibrosis.  No change since previous study. Normal heart size and pulmonary  vascularity. No focal airspace disease or consolidation in the  lungs. No blunting of costophrenic angles. No pneumothorax.  Mediastinal contours appear intact.     IMPRESSION:  No active disease.      Electronically Signed    By: Lucienne Capers M.D.    On: 12/21/2014 21:49         Verified By: Neale Burly, M.D.,  Cardiology:    08-Apr-16 12:54, Echo Doppler  Echo Doppler   REASON FOR EXAM:      COMMENTS:       PROCEDURE: [facility] - ECHO DOPPLER COMPLETE(TRANSTHOR)  - Dec 22 2014 12:54PM     RESULT: Echocardiogram Report    Patient Name:   LASHAUNTA SICARD Date of Exam: 12/22/2014  Medical Rec #:  [id number]         Custom1:  Date of Birth:  02-Aug-1927      Height:  Patient Age:    76 years       Weight:  Patient Gender: F              BSA:    Indications: MI  Sonographer:    Sherrie Sport RDCS  Referring Phys: Valentino Nose, K    Summary:  1. Essentially a normal study   2. Left ventricular ejection fraction, by visual estimation, is 55 to   60%.   3. Normal global left ventricular systolic function.   4. Normal right ventricular size and systolic function.   5. Mild left ventricular hypertrophy.   6. Mild mitral valve regurgitation.   7. Mild tricuspid regurgitation.   8. Normal pulmonary artery pressures  2D AND M-MODE MEASUREMENTS (normal ranges within parentheses):  Left Ventricle:          Normal  IVSd (2D):      1.23 cm (0.7-1.1)  LVPWd (2D):     1.36 cm  (0.7-1.1) Aorta/LA:                  Normal  LVIDd (2D):     3.75 cm (3.4-5.7) Aortic Root (2D): 2.50 cm (2.4-3.7)  LVIDs (2D):     2.70 cm           Left Atrium (2D): 2.80 cm (1.9-4.0)  LV FS (2D):     28.0 %   (>25%)  LV EF (2D):     55.0 %   (>50%)                        Right Ventricle:                                    RVd (2D):        2.68 cm  LV DIASTOLIC FUNCTION:  MV Peak E: 0.86 m/s E/e' Ratio: 15.20  MV Peak A: 0.84 m/s Decel Time: 352 msec  E/A Ratio: 1.02  SPECTRAL DOPPLER ANALYSIS (where applicable):  Mitral Valve:  MV P1/2 Time: 102.08 msec  MV Area, PHT: 2.16 cm??  Aortic Valve: AoV Max Vel: 1.52 m/s AoV Peak PG: 9.2 mmHg AoV Mean PG:  LVOT Vmax: 1.02 m/s LVOT VTI:  LVOT Diameter: 2.00 cm  AoV Area, Vmax: 2.11 cm?? AoV Area, VTI: AoV Area, Vmn:  Tricuspid Valve and PA/RV Systolic Pressure: TR Max Velocity: 2.46 m/s RA   Pressure: 5 mmHg RVSP/PASP: 29.1 mmHg  Pulmonic Valve:  PV Max Velocity: 1.10 m/s PV Max PG: 4.8 mmHg PV Mean PG:    PHYSICIAN INTERPRETATION:  Left Ventricle: The left ventricular internal cavity size was normal. LV   posterior wall thickness was normal. Mild left ventricular hypertrophy.   Global LV systolic function was normal. Left ventricular ejection   fraction, by visual estimation, is 55 to 60%. Spectral Doppler shows   normal pattern of LV diastolic filling.  Right Ventricle: Normal right ventricular size, wall thickness, and     systolic function. The right ventricular size is normal. Global RV   systolic function is normal.  Left Atrium: The left atrium is normal in size.  Right Atrium: The right atrium is normal in size.  Pericardium: There is no evidence of pericardial effusion.  Mitral Valve: The mitral valve is normal in structure. Mild mitral valve   regurgitation is seen.  Tricuspid Valve: The tricuspid valve is normal. Mild tricuspid   regurgitation is visualized. The tricuspid regurgitant velocity is 2.46   m/s, and with an  assumed right atrial pressure of 5 mmHg, the estimated   right ventricular systolic pressure is normalat 29.1 mmHg.  Aortic Valve: The aortic valve was not well seen. Mild aortic valve   sclerosis is present, with no evidence of  aortic valve stenosis. No   evidence of aortic valve regurgitation is seen.  Pulmonic Valve: The pulmonic valve is normal. Trace pulmonic valve     regurgitation.  Aorta: The aortic root and ascending aorta are structurally normal, with   no evidence of dilitation.    [id number] Ida Rogue MD  Electronically signed by [id number] Ida Rogue MD  Signature Date/Time: 12/22/2014/2:07:55 PM    *** Final ***    IMPRESSION: .        Verified By: Minna Merritts, M.D., MD    Codeine: N/V  atorvastatin: Unknown  Beta Blockers: Unknown  Vital Signs/Nurse's Notes: **Vital Signs.:   08-Apr-16 07:37  Vital Signs Type Q 8hr  Temperature Temperature (F) 98.3  Celsius 36.8  Temperature Source oral  Pulse Pulse 58  Respirations Respirations 16  Systolic BP Systolic BP 354  Diastolic BP (mmHg) Diastolic BP (mmHg) 63  Mean BP 75  Pulse Ox % Pulse Ox % 96  Pulse Ox Activity Level  At rest  Oxygen Delivery Room Air/ 21 %    Impression 80 year old female with history of CAD s/p remote stenting to RCA and LCx in 2000, chronic pancreatitis, chronic abdominal pain, COPD not on home O2, previous left hip fracture, recent femur fracture, HTN, HLD, palpitations, bradycardia, depression, and peripheral neuropathy who preented to [facility] on 12/21/2014 with a 2 month history of intermittent nausea and vomiting after recently being admitted to [facility] 3/22-3/30 for the same in the setting of UTI at that time. Cardiology is consulted for elevated troponin peak of 0.06.  1. Elevated troponin: -Likely supply demand ischemia in the setting of her nausea, vomiting, and decreased po appetite over an extended time period - denies any anginal symptoms, should she develop any symptoms  concerning for angina can follow up with [facility] or [place] -Multiple cardiac caths since 2000, most recently in 2013, that has been treated medically by primary cardiologist  -Echo showed showed LV function -Continue aspirin, statin, and b-blocker as BP allows outpt follow up  2. Intermittent nausea and vomiting/chronic pancreatits/generalized weakness: -Followed by [facility], recent admission for same 3/22-3/30 -Improved -Has been Rx'd Protonix by [facility], which helps, though she refuses to take at times -Continue Protonix per primary treating team -Could add Remeron vs Megace to stimulate appetite  3. Recent femur fracture: -PT -Will be discharged back to rehab  4. Hypokalemia: -Replete to 4.0  5. COPD: -No exacerbation   6. Anemia: -Likely chronic -Follow up with PCP   Electronic Signatures: Rise Mu (PA-C)  (Signed 08-Apr-16 14:51)  Authored: General Aspect/Present Illness, History and Physical Exam, Review of System, Family & Social History, Past Medical History, Home Medications, Labs, EKG , Radiology, Allergies, Vital Signs/Nurse's Notes, Impression/Plan Ida Rogue (MD)  (Signed 09-Apr-16 10:12)  Authored: General Aspect/Present Illness, History and Physical Exam, Review of System, Family & Social History, EKG , Radiology, Vital Signs/Nurse's Notes, Impression/Plan  Co-Signer: General Aspect/Present Illness, History and Physical Exam, Review of System, Family & Social History, Past Medical History, Home Medications, Labs, EKG , Radiology, Allergies, Vital Signs/Nurse's Notes, Impression/Plan   Last Updated: 09-Apr-16 10:12 by Ida Rogue (MD)

## 2015-08-07 ENCOUNTER — Ambulatory Visit (INDEPENDENT_AMBULATORY_CARE_PROVIDER_SITE_OTHER): Payer: Medicare PPO

## 2015-08-07 ENCOUNTER — Ambulatory Visit
Admission: EM | Admit: 2015-08-07 | Discharge: 2015-08-07 | Disposition: A | Discharge status: Home / Self Care | Payer: Medicare PPO | Attending: Family Medicine | Admitting: Family Medicine

## 2015-08-07 ENCOUNTER — Encounter: Payer: Self-pay | Admitting: Emergency Medicine

## 2015-08-07 DIAGNOSIS — R112 Nausea with vomiting, unspecified: Secondary | ICD-10-CM | POA: Diagnosis not present

## 2015-08-07 DIAGNOSIS — J209 Acute bronchitis, unspecified: Secondary | ICD-10-CM

## 2015-08-07 LAB — CBC WITH DIFFERENTIAL/PLATELET
BASOS PCT: 0 %
Basophils Absolute: 0.1 10*3/uL (ref 0–0.1)
EOS ABS: 0.2 10*3/uL (ref 0–0.7)
Eosinophils Relative: 1 %
HEMATOCRIT: 36.2 % (ref 35.0–47.0)
HEMOGLOBIN: 12 g/dL (ref 12.0–16.0)
LYMPHS ABS: 0.8 10*3/uL — AB (ref 1.0–3.6)
Lymphocytes Relative: 4 %
MCH: 30.3 pg (ref 26.0–34.0)
MCHC: 33.2 g/dL (ref 32.0–36.0)
MCV: 91.4 fL (ref 80.0–100.0)
MONO ABS: 1.2 10*3/uL — AB (ref 0.2–0.9)
MONOS PCT: 7 %
Neutro Abs: 14.8 10*3/uL — ABNORMAL HIGH (ref 1.4–6.5)
Neutrophils Relative %: 88 %
Platelets: 269 10*3/uL (ref 150–440)
RBC: 3.96 MIL/uL (ref 3.80–5.20)
RDW: 14.1 % (ref 11.5–14.5)
WBC: 16.9 10*3/uL — ABNORMAL HIGH (ref 3.6–11.0)

## 2015-08-07 LAB — COMPREHENSIVE METABOLIC PANEL
ALBUMIN: 3.5 g/dL (ref 3.5–5.0)
ALT: 10 U/L — ABNORMAL LOW (ref 14–54)
ANION GAP: 11 (ref 5–15)
AST: 24 U/L (ref 15–41)
Alkaline Phosphatase: 71 U/L (ref 38–126)
BUN: 20 mg/dL (ref 6–20)
CO2: 20 mmol/L — ABNORMAL LOW (ref 22–32)
Calcium: 9.9 mg/dL (ref 8.9–10.3)
Chloride: 104 mmol/L (ref 101–111)
Creatinine, Ser: 0.86 mg/dL (ref 0.44–1.00)
GFR calc Af Amer: >60 mL/min (ref >60)
GFR calc non Af Amer: 59 mL/min — ABNORMAL LOW (ref >60)
GLUCOSE: 115 mg/dL — AB (ref 65–99)
POTASSIUM: 3.8 mmol/L (ref 3.5–5.1)
Sodium: 135 mmol/L (ref 135–145)
TOTAL PROTEIN: 6.9 g/dL (ref 6.5–8.1)
Total Bilirubin: 0.6 mg/dL (ref 0.3–1.2)

## 2015-08-07 MED ORDER — AZITHROMYCIN 250 MG PO TABS: ORAL_TABLET | ORAL | Status: DC

## 2015-08-07 MED ORDER — PREDNISONE 10 MG (21) PO TBPK: ORAL_TABLET | ORAL | Status: AC

## 2015-08-07 MED ORDER — ONDANSETRON HCL 4 MG/2ML IJ SOLN: 8.0000 mg | Freq: Once | INTRAMUSCULAR | Status: DC

## 2015-08-07 MED ORDER — IPRATROPIUM-ALBUTEROL 0.5-2.5 (3) MG/3ML IN SOLN
3.0000 mL | Freq: Once | RESPIRATORY_TRACT | Status: AC
  Administered 2015-08-07: 3 mL via RESPIRATORY_TRACT

## 2015-08-07 MED ORDER — ONDANSETRON 8 MG PO TBDP
8.0000 mg | ORAL_TABLET | Freq: Once | ORAL | Status: AC
  Administered 2015-08-07: 8 mg via ORAL

## 2015-08-07 MED ORDER — BENZONATATE 200 MG PO CAPS: 200.0000 mg | ORAL_CAPSULE | Freq: Three times a day (TID) | ORAL | Status: DC | PRN

## 2015-08-07 MED ORDER — ONDANSETRON 8 MG PO TBDP: 8.0000 mg | ORAL_TABLET | Freq: Three times a day (TID) | ORAL | Status: AC | PRN

## 2015-08-07 NOTE — ED Notes (Signed)
States vomited bile emesis at home, and on arrival to assigned room vomited large amount  bile

## 2015-08-07 NOTE — Discharge Instructions (Signed)
Acute Bronchitis  °Bronchitis is inflammation of the airways that extend from the windpipe into the lungs (bronchi). The inflammation often causes mucus to develop. This leads to a cough, which is the most common symptom of bronchitis.  ° °In acute bronchitis, the condition usually develops suddenly and goes away over time, usually in a couple weeks. Smoking, allergies, and asthma can make bronchitis worse. Repeated episodes of bronchitis may cause further lung problems.  °CAUSES  °Acute bronchitis is most often caused by the same virus that causes a cold. The virus can spread from person to person (contagious) through coughing, sneezing, and touching contaminated objects.  °SIGNS AND SYMPTOMS  °Cough.  °Fever.  °Coughing up mucus.  °Body aches.  °Chest congestion.  °Chills.  °Shortness of breath.  °Sore throat.  °DIAGNOSIS  °Acute bronchitis is usually diagnosed through a physical exam. Your health care provider will also ask you questions about your medical history. Tests, such as chest X-rays, are sometimes done to rule out other conditions.  °TREATMENT  °Acute bronchitis usually goes away in a couple weeks. Oftentimes, no medical treatment is necessary. Medicines are sometimes given for relief of fever or cough. Antibiotic medicines are usually not needed but may be prescribed in certain situations. In some cases, an inhaler may be recommended to help reduce shortness of breath and control the cough. A cool mist vaporizer may also be used to help thin bronchial secretions and make it easier to clear the chest.  °HOME CARE INSTRUCTIONS  °Get plenty of rest.  °Drink enough fluids to keep your urine clear or pale yellow (unless you have a medical condition that requires fluid restriction). Increasing fluids may help thin your respiratory secretions (sputum) and reduce chest congestion, and it will prevent dehydration.  °Take medicines only as directed by your health care provider.  °If you were prescribed an  antibiotic medicine, finish it all even if you start to feel better.  °Avoid smoking and secondhand smoke. Exposure to cigarette smoke or irritating chemicals will make bronchitis worse. If you are a smoker, consider using nicotine gum or skin patches to help control withdrawal symptoms. Quitting smoking will help your lungs heal faster.  °Reduce the chances of another bout of acute bronchitis by washing your hands frequently, avoiding people with cold symptoms, and trying not to touch your hands to your mouth, nose, or eyes.  °Keep all follow-up visits as directed by your health care provider.  °SEEK MEDICAL CARE IF:  °Your symptoms do not improve after 1 week of treatment.  °SEEK IMMEDIATE MEDICAL CARE IF:  °You develop an increased fever or chills.  °You have chest pain.  °You have severe shortness of breath.  °You have bloody sputum.  °You develop dehydration.  °You faint or repeatedly feel like you are going to pass out.  °You develop repeated vomiting.  °You develop a severe headache. °MAKE SURE YOU:  °Understand these instructions.  °Will watch your condition.  °Will get help right away if you are not doing well or get worse. °This information is not intended to replace advice given to you by your health care provider. Make sure you discuss any questions you have with your health care provider.  °Document Released: 10/09/2004 Document Revised: 09/22/2014 Document Reviewed: 02/22/2013  °Elsevier Interactive Patient Education ©2016 [employer]  ° ° °Nausea and Vomiting °Nausea means you feel sick to your stomach. Throwing up (vomiting) is a reflex where stomach contents come out of your mouth. °HOME CARE  °·   Take medicine as told by your doctor. °· Do not force yourself to eat. However, you do need to drink fluids. °· If you feel like eating, eat a normal diet as told by your doctor. °¨ Eat rice, wheat, potatoes, bread, lean meats, yogurt, fruits, and vegetables. °¨ Avoid high-fat foods. °· Drink enough  fluids to keep your pee (urine) clear or pale yellow. °· Ask your doctor how to replace body fluid losses (rehydrate). Signs of body fluid loss (dehydration) include: °¨ Feeling very thirsty. °¨ Dry lips and mouth. °¨ Feeling dizzy. °¨ Dark pee. °¨ Peeing less than normal. °¨ Feeling confused. °¨ Fast breathing or heart rate. °GET HELP RIGHT AWAY IF:  °· You have blood in your throw up. °· You have black or bloody poop (stool). °· You have a bad headache or stiff neck. °· You feel confused. °· You have bad belly (abdominal) pain. °· You have chest pain or trouble breathing. °· You do not pee at least once every 8 hours. °· You have cold, clammy skin. °· You keep throwing up after 24 to 48 hours. °· You have a fever. °MAKE SURE YOU:  °· Understand these instructions. °· Will watch your condition. °· Will get help right away if you are not doing well or get worse. °  °This information is not intended to replace advice given to you by your health care provider. Make sure you discuss any questions you have with your health care provider. °  °Document Released: 02/18/2008 Document Revised: 11/24/2011 Document Reviewed: 01/31/2011 °Elsevier Interactive Patient Education ©2016 [employer] ° ° ° °

## 2015-08-07 NOTE — ED Notes (Signed)
Patient states she woke this morning and felt nauseated and began vomiting.

## 2015-08-07 NOTE — ED Provider Notes (Signed)
CSN: [id number]     Arrival date & time 08/07/15  1046 History   First MD Initiated Contact with Patient 08/07/15 1108    Nurses notes were reviewed.  Chief Complaint  Patient presents with  . Nausea   Patient is a elderly a suture white female who according to her daughter has usually 1 or 2 just patient of bronchitis during the warmer months. 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 She states that she normally uses a walker but has not been using her walker lately and she was brought in from the car by wheelchair. According to her mother started throwing up this morning she's had a cold and cough for least of several days with nausea and vomiting started this morning she got up this both material that she's throwing up which is usual for her and they state that she has had bronchitis several times for the past. Initially told the nurse that she was too weak to stand up with them and say with this is her usual weakness and difficulty in relating that she has.  (Consider location/radiation/quality/duration/timing/severity/associated sxs/prior Treatment) Patient is a 81 y.o. female presenting with cough and vomiting. The history is provided by the patient. No language interpreter was used.  Cough Cough characteristics:  Non-productive Severity:  Severe Onset quality:  Gradual Duration:  4 days Timing:  Constant Progression:  Worsening Chronicity:  New Smoker: yes   Context: upper respiratory infection   Context: not animal exposure, not exposure to allergens, not fumes, not occupational exposure, not sick contacts and not smoke exposure   Relieved by:  Nothing Ineffective treatments:  None tried Associated symptoms: myalgias, shortness of breath and wheezing   Associated symptoms: no chest pain, no chills, no diaphoresis, no ear fullness, no fever, no rash, no sinus congestion and no sore  throat   Emesis Severity:  Moderate Duration:  4 hours Timing:  Intermittent Quality:  Bilious material Progression:  Worsening Chronicity:  New Recent urination:  Absent Relieved by:  Nothing Ineffective treatments:  None tried Associated symptoms: myalgias   Associated symptoms: no chills and no sore throat     History reviewed. No pertinent past medical history. Past Surgical History  Procedure Laterality Date  . Cholecystectomy     History reviewed. No pertinent family history. Social History  Substance Use Topics  . Smoking status: Never Smoker   . Smokeless tobacco: None  . Alcohol Use: No   OB History    No data available     Review of Systems  Constitutional: Positive for activity change. Negative for fever, chills and diaphoresis.  HENT: Negative for sore throat.   Respiratory: Positive for cough, shortness of breath and wheezing.   Cardiovascular: Negative for chest pain and palpitations.  Gastrointestinal: Positive for vomiting.  Musculoskeletal: Positive for myalgias.  Skin: Negative for rash.  Neurological: Positive for dizziness, weakness and light-headedness.  All other systems reviewed and are negative.   Allergies  Review of patient's allergies indicates not on file.  Home Medications   Prior to Admission medications   Medication Sig Start Date End Date Taking? Authorizing Provider  FLUoxetine (PROZAC WEEKLY) 90 MG DR capsule Take 90 mg by mouth every 7 (seven) days.   Yes Historical Provider, MD  isosorbide dinitrate (ISORDIL) 30 MG tablet Take 30 mg by mouth 4 (four) times daily.   Yes Historical  Provider, MD  nitroGLYCERIN (NITROSTAT) 0.4 MG SL tablet Place 0.4 mg under the tongue every 5 (five) minutes as needed for chest pain.   Yes Historical Provider, MD  pantoprazole (PROTONIX) 40 MG tablet Take 40 mg by mouth daily.   Yes Historical Provider, MD  pravastatin (PRAVACHOL) 40 MG tablet Take 40 mg by mouth daily.   Yes Historical Provider,  MD  traMADol (ULTRAM) 50 MG tablet Take by mouth every 6 (six) hours as needed.   Yes Historical Provider, MD  azithromycin (ZITHROMAX Z-PAK) 250 MG tablet Take 2 tablets first day and then 1 po a day for 4 days 08/07/15   Hassan Rowan, MD  benzonatate (TESSALON) 200 MG capsule Take 1 capsule (200 mg total) by mouth 3 (three) times daily as needed for cough. 08/07/15   Hassan Rowan, MD  ondansetron (ZOFRAN ODT) 8 MG disintegrating tablet Take 1 tablet (8 mg total) by mouth every 8 (eight) hours as needed for nausea or vomiting. 08/07/15   Hassan Rowan, MD  predniSONE (STERAPRED UNI-PAK 21 TAB) 10 MG (21) TBPK tablet Sig 6 tablet day 1, 5 tablets day 2, 4 tablets day 3,,3tablets day 4, 2 tablets day 5, 1 tablet day 6 take all tablets orally 08/07/15   Hassan Rowan, MD   Meds Ordered and Administered this Visit   Medications  ipratropium-albuterol (DUONEB) 0.5-2.5 (3) MG/3ML nebulizer solution 3 mL (3 mLs Nebulization Given 08/07/15 1140)  ondansetron (ZOFRAN-ODT) disintegrating tablet 8 mg (8 mg Oral Given 08/07/15 1130)    BP 114/92 mmHg  Pulse 96  Temp(Src) 97.3 F (36.3 C) (Tympanic)  SpO2 95% Orthostatic VS for the past 24 hrs:  BP- Lying Pulse- Lying BP- Sitting Pulse- Sitting BP- Standing at 0 minutes Pulse- Standing at 0 minutes  08/07/15 1116 (!) 114/92 mmHg 97 136/70 mmHg 88 133/79 mmHg 90    Physical Exam  Constitutional: She is oriented to person, place, and time. She appears distressed.  Elderly white female moaning but able to answer questions with prompting. Appears to be ill she has a emesis container next to her. Should be noted orthostatic vital signs were taken the patient shows increase blood pressure was standing but no increase of pulse upon standing that significant.  HENT:  Head: Normocephalic and atraumatic.  Eyes: Conjunctivae are normal. Pupils are equal, round, and reactive to light.  Neck: Normal range of motion. Neck supple.  Cardiovascular: Normal rate and  regular rhythm.   Pulmonary/Chest: Effort normal. No respiratory distress. She has wheezes.  Abdominal: Soft. She exhibits distension. There is no tenderness. There is no rebound and no guarding.  Musculoskeletal: Normal range of motion. She exhibits no edema.  Lymphadenopathy:    She has no cervical adenopathy.  Neurological: She is alert and oriented to person, place, and time. No cranial nerve deficit.  Skin: Skin is warm and dry.  Psychiatric: She has a normal mood and affect.  Vitals reviewed.   ED Course  Procedures (including critical care time)  Labs Review Labs Reviewed  CBC WITH DIFFERENTIAL/PLATELET - Abnormal; Notable for the following:    WBC 16.9 (*)    Neutro Abs 14.8 (*)    Lymphs Abs 0.8 (*)    Monocytes Absolute 1.2 (*)    All other components within normal limits  COMPREHENSIVE METABOLIC PANEL - Abnormal; Notable for the following:    CO2 20 (*)    Glucose, Bld 115 (*)    ALT 10 (*)    GFR calc non Af  Amer 64 (*)    All other components within normal limits  URINALYSIS COMPLETEWITH MICROSCOPIC [facility] ONLY)    Imaging Review Dg Chest 2 View  08/07/2015  CLINICAL DATA:  Productive cough, cold like symptoms for 5-6 days. Prior smoker. EXAM: CHEST  2 VIEW COMPARISON:  12/21/2014 FINDINGS: Diffuse interstitial prominence throughout the lungs, similar to prior study, likely chronic interstitial lung disease. Mild hyperinflation. Heart is normal size. No effusions or acute bony abnormality. IMPRESSION: Chronic interstitial prominence throughout the lungs most compatible with chronic interstitial lung disease. Mild hyperinflation. Electronically Signed   By: Charlett Nose M.D.   On: 08/07/2015 12:02     Visual Acuity Review  Right Eye Distance:   Left Eye Distance:   Bilateral Distance:    Right Eye Near:   Left Eye Near:    Bilateral Near:      Results for orders placed or performed during the hospital encounter of 08/07/15  CBC with Differential  Result  Value Ref Range   WBC 16.9 (H) 3.6 - 11.0 K/uL   RBC 3.96 3.80 - 5.20 MIL/uL   Hemoglobin 12.0 12.0 - 16.0 g/dL   HCT 16.1 09.6 - 04.5 %   MCV 91.4 80.0 - 100.0 fL   MCH 30.3 26.0 - 34.0 pg   MCHC 33.2 32.0 - 36.0 g/dL   RDW 40.9 81.1 - 91.4 %   Platelets 269 150 - 440 K/uL   Neutrophils Relative % 88 %   Neutro Abs 14.8 (H) 1.4 - 6.5 K/uL   Lymphocytes Relative 4 %   Lymphs Abs 0.8 (L) 1.0 - 3.6 K/uL   Monocytes Relative 7 %   Monocytes Absolute 1.2 (H) 0.2 - 0.9 K/uL   Eosinophils Relative 1 %   Eosinophils Absolute 0.2 0 - 0.7 K/uL   Basophils Relative 0 %   Basophils Absolute 0.1 0 - 0.1 K/uL  Comprehensive metabolic panel  Result Value Ref Range   Sodium 135 135 - 145 mmol/L   Potassium 3.8 3.5 - 5.1 mmol/L   Chloride 104 101 - 111 mmol/L   CO2 20 (L) 22 - 32 mmol/L   Glucose, Bld 115 (H) 65 - 99 mg/dL   BUN 20 6 - 20 mg/dL   Creatinine, Ser 7.82 0.44 - 1.00 mg/dL   Calcium 9.9 8.9 - 95.6 mg/dL   Total Protein 6.9 6.5 - 8.1 g/dL   Albumin 3.5 3.5 - 5.0 g/dL   AST 24 15 - 41 U/L   ALT 10 (L) 14 - 54 U/L   Alkaline Phosphatase 71 38 - 126 U/L   Total Bilirubin 0.6 0.3 - 1.2 mg/dL   GFR calc non Af Amer 59 (L) >60 mL/min   GFR calc Af Amer >60 >60 mL/min   Anion gap 11 5 - 15    MDM   1. Bronchitis, acute, with bronchospasm   2. Non-intractable vomiting with nausea, vomiting of unspecified type      Patient will be given a DuoNeb treatment to try to help with the bronchospasm Zofran for nausea and pending x-ray of the lungs. Patient reports breathing much improved after DuoNeb treatment. Zofran also given sublingual is helped the nausea and vomiting as well. Explained to her daughter that she has interstitial lung disease which was shown on the other x-ray. We'll place on Z-Pak and Tessalon Perles, Zofran for nausea and vomiting and will place on 6 day course of prednisone taper. Follow-up PCP if not better or go to emergency room  if symptoms become acutely  worse.      Hassan RowanEugene Tayanna Talford, MD 08/07/15 [id number]

## 2016-02-03 ENCOUNTER — Ambulatory Visit (INDEPENDENT_AMBULATORY_CARE_PROVIDER_SITE_OTHER): Payer: Medicare PPO

## 2016-02-03 ENCOUNTER — Ambulatory Visit
Admission: EM | Admit: 2016-02-03 | Discharge: 2016-02-03 | Disposition: A | Discharge status: Home / Self Care | Payer: Medicare PPO | Attending: Family Medicine | Admitting: Family Medicine

## 2016-02-03 ENCOUNTER — Encounter: Payer: Self-pay | Admitting: Gynecology

## 2016-02-03 DIAGNOSIS — W19XXXA Unspecified fall, initial encounter: Secondary | ICD-10-CM

## 2016-02-03 DIAGNOSIS — R52 Pain, unspecified: Secondary | ICD-10-CM

## 2016-02-03 DIAGNOSIS — S20212A Contusion of left front wall of thorax, initial encounter: Secondary | ICD-10-CM

## 2016-02-03 DIAGNOSIS — Y92099 Unspecified place in other non-institutional residence as the place of occurrence of the external cause: Secondary | ICD-10-CM | POA: Diagnosis not present

## 2016-02-03 DIAGNOSIS — Y92009 Unspecified place in unspecified non-institutional (private) residence as the place of occurrence of the external cause: Secondary | ICD-10-CM

## 2016-02-03 HISTORY — DX: Hematuria, unspecified: R31.9

## 2016-02-03 HISTORY — DX: Essential (primary) hypertension: I10

## 2016-02-03 HISTORY — DX: Cardiac murmur, unspecified: R01.1

## 2016-02-03 HISTORY — DX: Hyperlipidemia, unspecified: E78.5

## 2016-02-03 HISTORY — DX: Polyneuropathy, unspecified: G62.9

## 2016-02-03 HISTORY — DX: Major depressive disorder, single episode, unspecified: F32.9

## 2016-02-03 HISTORY — DX: Atherosclerotic heart disease of native coronary artery without angina pectoris: I25.10

## 2016-02-03 HISTORY — DX: Prediabetes: R73.03

## 2016-02-03 HISTORY — DX: Acute pancreatitis without necrosis or infection, unspecified: K85.90

## 2016-02-03 HISTORY — DX: Solitary pulmonary nodule: R91.1

## 2016-02-03 HISTORY — DX: Disorder of kidney and ureter, unspecified: N28.9

## 2016-02-03 HISTORY — DX: Heart failure, unspecified: I50.9

## 2016-02-03 HISTORY — DX: Depression, unspecified: F32.A

## 2016-02-03 HISTORY — DX: Gastro-esophageal reflux disease without esophagitis: K21.9

## 2016-02-03 HISTORY — DX: Unspecified osteoarthritis, unspecified site: M19.90

## 2016-02-03 NOTE — ED Notes (Signed)
Per patient fell off bed x 2 days ago. Patient stated left side rib pain. Per daughter pt fell while getting out of bed Friday night going to the bathroom.

## 2016-02-03 NOTE — ED Provider Notes (Signed)
CSN: [id number]     Arrival date & time 02/03/16  1333 History   First MD Initiated Contact with Patient 02/03/16 1354     Chief Complaint  Patient presents with  . Rib Injury   (Consider location/radiation/quality/duration/timing/severity/associated sxs/prior Treatment) HPI: Patient presents after a fall from bed 2 days ago. Patient complains of left-sided rib pain. She denies any fever or chills or any shortness of breath. Daughter states that she was getting out of bed on Friday to go to the bathroom when she fell onto her walker. She denies any other injury anywhere else. She denies any head injury. She denies any bruising of the area.  Past Medical History  Diagnosis Date  . Hypertension   . CAD (coronary artery disease)   . Hyperlipemia   . Prediabetes   . Pulmonary nodule   . Pancreatitis   . Osteoarthritis   . Depression   . Peripheral neuropathy (HCC)   . GERD (gastroesophageal reflux disease)   . CHF (congestive heart failure) (HCC)   . Heart murmur   . Renal lesion   . Hematuria    Past Surgical History  Procedure Laterality Date  . Cholecystectomy    . Cardiac catheterization    . Coronary angioplasty    . Back surgery      x 3  . Hip surgery Right   . Total knee arthroplasty    . Hiatal hernia repair    . Abdominal surgery    . Edg    . Endoscopic retrograde cholangiopancreatography (ercp) with propofol     No family history on file. Social History  Substance Use Topics  . Smoking status: Former Smoker    Quit date: 01/22/2009  . Smokeless tobacco: None  . Alcohol Use: No   OB History    No data available     Review of Systems: Negative except mentioned above.   Allergies  Codeine; Gabapentin; and Sulfa antibiotics  Home Medications   Prior to Admission medications   Medication Sig Start Date End Date Taking? Authorizing Provider  acetaminophen (TYLENOL) 325 MG tablet Take 650 mg by mouth every 6 (six) hours as needed.   Yes Historical  Provider, MD  albuterol (PROVENTIL HFA;VENTOLIN HFA) 108 (90 Base) MCG/ACT inhaler Inhale into the lungs every 6 (six) hours as needed for wheezing or shortness of breath.   Yes Historical Provider, MD  alendronate (FOSAMAX) 70 MG tablet Take 70 mg by mouth once a week. Take with a full glass of water on an empty stomach.   Yes Historical Provider, MD  bisacodyl (DULCOLAX) 5 MG EC tablet Take 10 mg by mouth daily as needed for moderate constipation.   Yes Historical Provider, MD  FLUoxetine (PROZAC WEEKLY) 90 MG DR capsule Take 90 mg by mouth every 7 (seven) days.   Yes Historical Provider, MD  fluticasone (FLONASE) 50 MCG/ACT nasal spray Place into both nostrils daily.   Yes Historical Provider, MD  isosorbide dinitrate (ISORDIL) 30 MG tablet Take 30 mg by mouth 4 (four) times daily.   Yes Historical Provider, MD  Lido-Capsaicin-Men-Methyl Sal (MEDI-PATCH-LIDOCAINE) 0.5-0.035-5-20 % PTCH Apply topically.   Yes Historical Provider, MD  magnesium oxide (MAG-OX) 400 MG tablet Take 400 mg by mouth daily.   Yes Historical Provider, MD  nitroGLYCERIN (NITROSTAT) 0.4 MG SL tablet Place 0.4 mg under the tongue every 5 (five) minutes as needed for chest pain.   Yes Historical Provider, MD  pantoprazole (PROTONIX) 40 MG tablet Take 40 mg by  mouth daily.   Yes Historical Provider, MD  pravastatin (PRAVACHOL) 40 MG tablet Take 40 mg by mouth daily.   Yes Historical Provider, MD  traMADol (ULTRAM) 50 MG tablet Take by mouth every 6 (six) hours as needed.   Yes Historical Provider, MD  azithromycin (ZITHROMAX Z-PAK) 250 MG tablet Take 2 tablets first day and then 1 po a day for 4 days 08/07/15   Hassan RowanEugene Wade, MD  benzonatate (TESSALON) 200 MG capsule Take 1 capsule (200 mg total) by mouth 3 (three) times daily as needed for cough. 08/07/15   Hassan RowanEugene Wade, MD  ondansetron (ZOFRAN ODT) 8 MG disintegrating tablet Take 1 tablet (8 mg total) by mouth every 8 (eight) hours as needed for nausea or vomiting. 08/07/15    Hassan RowanEugene Wade, MD  predniSONE (STERAPRED UNI-PAK 21 TAB) 10 MG (21) TBPK tablet Sig 6 tablet day 1, 5 tablets day 2, 4 tablets day 3,,3tablets day 4, 2 tablets day 5, 1 tablet day 6 take all tablets orally 08/07/15   Hassan RowanEugene Wade, MD   Meds Ordered and Administered this Visit  Medications - No data to display  BP 110/48 mmHg  Pulse 65  Temp(Src) 98 F (36.7 C) (Oral)  Resp 16  Ht 5\' 6"  (1.676 m)  Wt 143 lb (64.864 kg)  BMI 23.09 kg/m2  SpO2 99% No data found.   Physical Exam   GENERAL: sitting in wheelchair, NAD RESP: mild to moderate tenderness along left lateral ribcage, no ecchymosis, no step-off, CTA B CARD: RRR NEURO: AAO  ED Course  Procedures (including critical care time)  Labs Review Labs Reviewed - No data to display  Imaging Review Dg Ribs Unilateral W/chest Left  02/03/2016  CLINICAL DATA:  Fall 2 days ago with left-sided rib pain. EXAM: LEFT RIBS AND CHEST - 3+ VIEW COMPARISON:  August 07, 2015 FINDINGS: No pneumothorax. The heart, hila, and mediastinum are unremarkable. Reticular changes in the lungs consistent with interstitial/fibrotic changes, similar in the interval. No nodules or masses. A healed right fourth rib fracture seen. No acute left rib fractures are seen. IMPRESSION: No acute rib fractures. Interstitial/fibrotic changes in the lungs are similar in the interval. Electronically Signed   By: Gerome Samavid  Williams III M.D   On: 02/03/2016 14:59    MDM   1. Pain   2. Fall at home, initial encounter    No acute fracture noted on the left side. Discussed x-ray results with patient. Daughter states the patient does have an incentive spirometer at home she can use. Instructed patient on this. Discussed use of ice on the area. OTC pain medication discussed. Follow up with primary care physician as instructed.    Jolene ProvostKirtida Felica Chargois, MD 02/13/16 1515

## 2016-02-03 NOTE — Discharge Instructions (Signed)
Patient has Tramadol at home for pain, Tylenol when necessary, ice when necessary. She also states that she has an incentive spirometer at home as well.

## 2016-09-16 ENCOUNTER — Emergency Department: Payer: Medicare PPO

## 2016-09-16 ENCOUNTER — Encounter: Payer: Self-pay | Admitting: *Deleted

## 2016-09-16 ENCOUNTER — Ambulatory Visit (INDEPENDENT_AMBULATORY_CARE_PROVIDER_SITE_OTHER)
Admission: EM | Admit: 2016-09-16 | Discharge: 2016-09-16 | Disposition: A | Discharge status: Other Acute Inpatient Hospital | Payer: Medicare PPO | Source: Home / Self Care | Attending: Family Medicine | Admitting: Family Medicine

## 2016-09-16 ENCOUNTER — Emergency Department
Admission: EM | Admit: 2016-09-16 | Discharge: 2016-09-16 | Disposition: A | Discharge status: Home / Self Care | Payer: Medicare PPO | Attending: Emergency Medicine | Admitting: Emergency Medicine

## 2016-09-16 ENCOUNTER — Encounter: Payer: Self-pay | Admitting: Emergency Medicine

## 2016-09-16 DIAGNOSIS — Z888 Allergy status to other drugs, medicaments and biological substances status: Secondary | ICD-10-CM | POA: Insufficient documentation

## 2016-09-16 DIAGNOSIS — Z87891 Personal history of nicotine dependence: Secondary | ICD-10-CM | POA: Diagnosis not present

## 2016-09-16 DIAGNOSIS — Z79899 Other long term (current) drug therapy: Secondary | ICD-10-CM | POA: Insufficient documentation

## 2016-09-16 DIAGNOSIS — F329 Major depressive disorder, single episode, unspecified: Secondary | ICD-10-CM

## 2016-09-16 DIAGNOSIS — E785 Hyperlipidemia, unspecified: Secondary | ICD-10-CM | POA: Insufficient documentation

## 2016-09-16 DIAGNOSIS — R9431 Abnormal electrocardiogram [ECG] [EKG]: Secondary | ICD-10-CM | POA: Insufficient documentation

## 2016-09-16 DIAGNOSIS — Z9049 Acquired absence of other specified parts of digestive tract: Secondary | ICD-10-CM

## 2016-09-16 DIAGNOSIS — R0602 Shortness of breath: Secondary | ICD-10-CM

## 2016-09-16 DIAGNOSIS — Z9889 Other specified postprocedural states: Secondary | ICD-10-CM

## 2016-09-16 DIAGNOSIS — I110 Hypertensive heart disease with heart failure: Secondary | ICD-10-CM | POA: Insufficient documentation

## 2016-09-16 DIAGNOSIS — R079 Chest pain, unspecified: Secondary | ICD-10-CM

## 2016-09-16 DIAGNOSIS — R911 Solitary pulmonary nodule: Secondary | ICD-10-CM | POA: Insufficient documentation

## 2016-09-16 DIAGNOSIS — I509 Heart failure, unspecified: Secondary | ICD-10-CM | POA: Insufficient documentation

## 2016-09-16 DIAGNOSIS — R0789 Other chest pain: Secondary | ICD-10-CM

## 2016-09-16 DIAGNOSIS — R05 Cough: Secondary | ICD-10-CM | POA: Diagnosis not present

## 2016-09-16 DIAGNOSIS — I2510 Atherosclerotic heart disease of native coronary artery without angina pectoris: Secondary | ICD-10-CM

## 2016-09-16 DIAGNOSIS — I200 Unstable angina: Secondary | ICD-10-CM

## 2016-09-16 LAB — CBC
HCT: 37 % (ref 35.0–47.0)
Hemoglobin: 12.3 g/dL (ref 12.0–16.0)
MCH: 31.2 pg (ref 26.0–34.0)
MCHC: 33.2 g/dL (ref 32.0–36.0)
MCV: 93.9 fL (ref 80.0–100.0)
PLATELETS: 223 10*3/uL (ref 150–440)
RBC: 3.94 MIL/uL (ref 3.80–5.20)
RDW: 15.3 % — AB (ref 11.5–14.5)
WBC: 9.8 10*3/uL (ref 3.6–11.0)

## 2016-09-16 LAB — BASIC METABOLIC PANEL
Anion gap: 6 (ref 5–15)
BUN: 16 mg/dL (ref 6–20)
CO2: 25 mmol/L (ref 22–32)
CREATININE: 0.9 mg/dL (ref 0.44–1.00)
Calcium: 10.1 mg/dL (ref 8.9–10.3)
Chloride: 106 mmol/L (ref 101–111)
GFR calc Af Amer: >60 mL/min (ref >60)
GFR, EST NON AFRICAN AMERICAN: 56 mL/min — AB (ref >60)
Glucose, Bld: 92 mg/dL (ref 65–99)
Potassium: 3.7 mmol/L (ref 3.5–5.1)
SODIUM: 137 mmol/L (ref 135–145)

## 2016-09-16 LAB — FIBRIN DERIVATIVES D-DIMER (ARMC ONLY): FIBRIN DERIVATIVES D-DIMER (ARMC): 794 — AB (ref 0–499)

## 2016-09-16 LAB — TROPONIN I: Troponin I: <0.03 ng/mL (ref <0.03)

## 2016-09-16 MED ORDER — IOPAMIDOL (ISOVUE-370) INJECTION 76%
75.0000 mL | Freq: Once | INTRAVENOUS | Status: AC | PRN
  Administered 2016-09-16: 75 mL via INTRAVENOUS

## 2016-09-16 MED ORDER — ONDANSETRON 4 MG PO TBDP
4.0000 mg | ORAL_TABLET | Freq: Once | ORAL | Status: AC
  Administered 2016-09-16: 4 mg via ORAL
  Filled 2016-09-16: qty 1

## 2016-09-16 MED ORDER — ASPIRIN 81 MG PO CHEW
324.0000 mg | CHEWABLE_TABLET | Freq: Once | ORAL | Status: AC
  Administered 2016-09-16: 324 mg via ORAL

## 2016-09-16 MED ORDER — HYDROMORPHONE HCL 1 MG/ML IJ SOLN
0.5000 mg | Freq: Once | INTRAMUSCULAR | Status: AC
  Administered 2016-09-16: 0.5 mg via INTRAMUSCULAR
  Filled 2016-09-16: qty 1

## 2016-09-16 MED ORDER — OXYCODONE-ACETAMINOPHEN 5-325 MG PO TABS
2.0000 | ORAL_TABLET | Freq: Once | ORAL | Status: AC
  Administered 2016-09-16: 2 via ORAL
  Filled 2016-09-16: qty 2

## 2016-09-16 MED ORDER — LORAZEPAM 0.5 MG PO TABS
0.5000 mg | ORAL_TABLET | Freq: Once | ORAL | Status: AC
  Administered 2016-09-16: 0.5 mg via ORAL
  Filled 2016-09-16: qty 1

## 2016-09-16 NOTE — ED Provider Notes (Signed)
[facility] Emergency Department Provider Note        Time seen: ----------------------------------------- 1:37 PM on 09/16/2016 -----------------------------------------    I have reviewed the triage vital signs and the nursing notes.   HISTORY  Chief Complaint Chest Pain    HPI Tamara Rich is a 83 y.o. female who presents to the ER for right-sided chest pain for the last 3 days. Patient is a clear productive cough and pain in her chest with cough and movement. EMS reports rhonchi in both lungs. She describes pain as 1010 and the worst ever. Nothing makes it better. She denies fevers or chills.   Past Medical History:  Diagnosis Date  . CAD (coronary artery disease)   . CHF (congestive heart failure) (HCC)   . Depression   . GERD (gastroesophageal reflux disease)   . Heart murmur   . Hematuria   . Hyperlipemia   . Hypertension   . Osteoarthritis   . Pancreatitis   . Peripheral neuropathy (HCC)   . Prediabetes   . Pulmonary nodule   . Renal lesion     There are no active problems to display for this patient.   Past Surgical History:  Procedure Laterality Date  . ABDOMINAL SURGERY    . BACK SURGERY     x 3  . CARDIAC CATHETERIZATION    . CHOLECYSTECTOMY    . CORONARY ANGIOPLASTY    . edg    . ENDOSCOPIC RETROGRADE CHOLANGIOPANCREATOGRAPHY (ERCP) WITH PROPOFOL    . HIATAL HERNIA REPAIR    . HIP SURGERY Right   . TOTAL KNEE ARTHROPLASTY      Allergies Codeine; Gabapentin; and Sulfa antibiotics  Social History Social History  Substance Use Topics  . Smoking status: Former Smoker    Quit date: 01/22/2009  . Smokeless tobacco: Never Used  . Alcohol use No    Review of Systems Constitutional: Negative for fever. Cardiovascular: Positive for chest pain Respiratory: Negative for shortness of breath. Positive for cough Gastrointestinal: Negative for abdominal pain, vomiting and diarrhea. Genitourinary: Negative for  dysuria. Musculoskeletal: Negative for back pain. Skin: Negative for rash. Neurological: Negative for headaches, focal weakness or numbness.  10-point ROS otherwise negative.  ____________________________________________   PHYSICAL EXAM:  VITAL SIGNS: ED Triage Vitals [09/16/16 1253]  Enc Vitals Group     BP (!) 148/70     Pulse Rate (!) 59     Resp 17     Temp 97.8 F (36.6 C)     Temp Source Oral     SpO2 99 %     Weight 134 lb (60.8 kg)     Height 5\' 6"  (1.676 m)     Head Circumference      Peak Flow      Pain Score 10     Pain Loc      Pain Edu?      Excl. in GC?     Constitutional: Alert and oriented. Well appearing and in no distress. Eyes: Conjunctivae are normal. PERRL. Normal extraocular movements. ENT   Head: Normocephalic and atraumatic.   Nose: No congestion/rhinnorhea.   Mouth/Throat: Mucous membranes are moist.   Neck: No stridor. Cardiovascular: Normal rate, regular rhythm. No murmurs, rubs, or gallops. Respiratory: Normal respiratory effort without tachypnea nor retractions. Breath sounds are clear and equal bilaterally. No wheezes/rales/rhonchi. Gastrointestinal: Soft and nontender. Normal bowel sounds Musculoskeletal: Nontender with normal range of motion in all extremities. No lower extremity tenderness nor edema.Severe right parasternal chest wall  tenderness Neurologic:  Normal speech and language. No gross focal neurologic deficits are appreciated.  Skin:  Skin is warm, dry and intact. No rash noted. Psychiatric: Mood and affect are normal. Speech and behavior are normal.  ____________________________________________  EKG: Interpreted by me. Sinus rhythm rate 58 bpm, normal PR interval, LVH with repolarization abnormality, normal QT interval, normal axis.  ____________________________________________  ED COURSE:  Pertinent labs & imaging results that were available during my care of the patient were reviewed by me and considered  in my medical decision making (see chart for details). Clinical Course   Patient presents to the ER with what appears to be musculoskeletal chest pain. We will assess with labs and imaging.  Procedures ____________________________________________   LABS (pertinent positives/negatives)  Labs Reviewed  BASIC METABOLIC PANEL - Abnormal; Notable for the following:       Result Value   GFR calc non Af Amer 56 (*)    All other components within normal limits  CBC - Abnormal; Notable for the following:    RDW 15.3 (*)    All other components within normal limits  FIBRIN DERIVATIVES D-DIMER ([facility] ONLY) - Abnormal; Notable for the following:    Fibrin derivatives D-dimer ([facility]) 794 (*)    All other components within normal limits  TROPONIN I  TROPONIN I    RADIOLOGY Images were viewed by me  Chest x-ray IMPRESSION: Interstitial fibrosis, stable. No frank edema or consolidation. Stable cardiac silhouette. There is aortic atherosclerosis. Bones are osteoporotic. ____________________________________________  FINAL ASSESSMENT AND PLAN  Chest pain  Plan: Patient with labs and imaging as dictated above. Other etiology for her symptoms at this time. Patient care checked out to Dr. Derrill Kay final disposition.   Emily Filbert, MD   Note: This dictation was prepared with Dragon dictation. Any transcriptional errors that result from this process are unintentional    Emily Filbert, MD 09/18/16 0800

## 2016-09-16 NOTE — Discharge Instructions (Addendum)
Please seek medical attention for any high fevers, chest pain, shortness of breath, change in behavior, persistent vomiting, bloody stool or any other new or concerning symptoms.  

## 2016-09-16 NOTE — ED Provider Notes (Addendum)
[facility]    CSN: [id number] Arrival date & time: 09/16/16  1249     History   Chief Complaint Chief Complaint  Patient presents with  . Chest Pain    HPI Tamara Rich is a 83 y.o. female.   Patient is an 83 year old white female who is brought in by her daughter chest pain shortness of breath and difficulty breathing. The patient is not a good historian and initially states she is unable to give me any history because of the mild pain that she's had. By reviewing the chart talked to the daughter and then again to the patient regarding to ascertain that the patient has had coronary artery disease long-standing history had stent and other procedures done about 10 years ago but the best the daughter states was that she's not had any significant cardiac problems for 10 years until almost a week ago. At that time she had chest pain she was taken [facility] ED and evaluated there. She had 2 sets of cardiac enzymes called the daughter there there were negative and she was sent home. Patient states that even though the pain got better initially this come back and is much worse now than is evident been. She never was completely pain-free she states either. She states that the pain is in the [place] chest peristaltic back and does go down both arms especially on the left more than the right. She states when she's had a cardiac problems over 10 years ago his pain is much worse. She also reports the pain was so bad this morning she finally called her daughter to be brought in to be seen. She is allergic to codeine gabapentin and sulfur. As stated above she has history of coronary artery disease CHF and depression, hyperlipidemia, pulmonary nodule. Along with cardiac catheterization cholecystectomy and coronary angioplasty has been done in the past. She is a former smoker stopped in 2010  Unable to really get a good past family medical history since the difficulty of getting her own  history   The history is provided by the patient. No language interpreter was used.  Chest Pain  Pain location:  Substernal area Pain quality: burning, radiating, sharp, stabbing, throbbing and tightness   Pain radiates to:  L shoulder, R shoulder, L arm, upper back and mid back Pain severity:  Severe Progression:  Worsening Chronicity:  New Context: breathing and movement   Relieved by:  Nothing Worsened by:  Movement and exertion Ineffective treatments:  None tried Associated symptoms: shortness of breath   Risk factors: coronary artery disease, high cholesterol, hypertension and smoking     Past Medical History:  Diagnosis Date  . CAD (coronary artery disease)   . CHF (congestive heart failure) (HCC)   . Depression   . GERD (gastroesophageal reflux disease)   . Heart murmur   . Hematuria   . Hyperlipemia   . Hypertension   . Osteoarthritis   . Pancreatitis   . Peripheral neuropathy (HCC)   . Prediabetes   . Pulmonary nodule   . Renal lesion     There are no active problems to display for this patient.   Past Surgical History:  Procedure Laterality Date  . ABDOMINAL SURGERY    . BACK SURGERY     x 3  . CARDIAC CATHETERIZATION    . CHOLECYSTECTOMY    . CORONARY ANGIOPLASTY    . edg    . ENDOSCOPIC RETROGRADE CHOLANGIOPANCREATOGRAPHY (ERCP) WITH PROPOFOL    . HIATAL  HERNIA REPAIR    . HIP SURGERY Right   . TOTAL KNEE ARTHROPLASTY      OB History    No data available       Home Medications    Prior to Admission medications   Medication Sig Start Date End Date Taking? Authorizing Provider  acetaminophen (TYLENOL) 325 MG tablet Take 650 mg by mouth every 6 (six) hours as needed.    Historical Provider, MD  albuterol (PROVENTIL HFA;VENTOLIN HFA) 108 (90 Base) MCG/ACT inhaler Inhale into the lungs every 6 (six) hours as needed for wheezing or shortness of breath.    Historical Provider, MD  alendronate (FOSAMAX) 70 MG tablet Take 70 mg by mouth once a  week. Take with a full glass of water on an empty stomach.    Historical Provider, MD  azithromycin (ZITHROMAX Z-PAK) 250 MG tablet Take 2 tablets first day and then 1 po a day for 4 days 08/07/15   Hassan Rowan, MD  benzonatate (TESSALON) 200 MG capsule Take 1 capsule (200 mg total) by mouth 3 (three) times daily as needed for cough. 08/07/15   Hassan Rowan, MD  bisacodyl (DULCOLAX) 5 MG EC tablet Take 10 mg by mouth daily as needed for moderate constipation.    Historical Provider, MD  FLUoxetine (PROZAC WEEKLY) 90 MG DR capsule Take 90 mg by mouth every 7 (seven) days.    Historical Provider, MD  fluticasone (FLONASE) 50 MCG/ACT nasal spray Place into both nostrils daily.    Historical Provider, MD  isosorbide dinitrate (ISORDIL) 30 MG tablet Take 30 mg by mouth 4 (four) times daily.    Historical Provider, MD  Lido-Capsaicin-Men-Methyl Sal (MEDI-PATCH-LIDOCAINE) 0.5-0.035-5-20 % PTCH Apply topically.    Historical Provider, MD  magnesium oxide (MAG-OX) 400 MG tablet Take 400 mg by mouth daily.    Historical Provider, MD  nitroGLYCERIN (NITROSTAT) 0.4 MG SL tablet Place 0.4 mg under the tongue every 5 (five) minutes as needed for chest pain.    Historical Provider, MD  ondansetron (ZOFRAN ODT) 8 MG disintegrating tablet Take 1 tablet (8 mg total) by mouth every 8 (eight) hours as needed for nausea or vomiting. 08/07/15   Hassan Rowan, MD  pantoprazole (PROTONIX) 40 MG tablet Take 40 mg by mouth daily.    Historical Provider, MD  pravastatin (PRAVACHOL) 40 MG tablet Take 40 mg by mouth daily.    Historical Provider, MD  predniSONE (STERAPRED UNI-PAK 21 TAB) 10 MG (21) TBPK tablet Sig 6 tablet day 1, 5 tablets day 2, 4 tablets day 3,,3tablets day 4, 2 tablets day 5, 1 tablet day 6 take all tablets orally 08/07/15   Hassan Rowan, MD  traMADol (ULTRAM) 50 MG tablet Take by mouth every 6 (six) hours as needed.    Historical Provider, MD    Family History No family history on file.  Social  History Social History  Substance Use Topics  . Smoking status: Former Smoker    Quit date: 01/22/2009  . Smokeless tobacco: Never Used  . Alcohol use No     Allergies   Codeine; Gabapentin; and Sulfa antibiotics   Review of Systems Review of Systems  Unable to perform ROS: Age  Respiratory: Positive for shortness of breath.   Cardiovascular: Positive for chest pain.     Physical Exam Triage Vital Signs ED Triage Vitals [09/16/16 1253]  Enc Vitals Group     BP (!) 148/70     Pulse Rate (!) 59     Resp 17  Temp 97.8 F (36.6 C)     Temp Source Oral     SpO2 99 %     Weight 134 lb (60.8 kg)     Height 5\' 6"  (1.676 m)     Head Circumference      Peak Flow      Pain Score 10     Pain Loc      Pain Edu?      Excl. in GC?    No data found.   Updated Vital Signs BP (!) 148/70   Pulse (!) 59   Temp 97.8 F (36.6 C) (Oral)   Resp 17   Ht 5\' 6"  (1.676 m)   Wt 134 lb (60.8 kg)   SpO2 99%   BMI 21.63 kg/m   Visual Acuity Right Eye Distance:   Left Eye Distance:   Bilateral Distance:    Right Eye Near:   Left Eye Near:    Bilateral Near:     Physical Exam  Constitutional: She appears cachectic. She appears ill. She appears distressed.  Elderly white female in significant  distress  HENT:  Head: Normocephalic.  Eyes: Pupils are equal, round, and reactive to light. Left eye exhibits no discharge.  Neck: Normal range of motion.  Cardiovascular: Normal rate and regular rhythm.   Pulmonary/Chest: Effort normal and breath sounds normal.  Abdominal: Soft. Normal appearance and bowel sounds are normal. There is no hepatosplenomegaly. There is no tenderness. There is no CVA tenderness. No hernia.  1. Scar is present over the abdomen  Musculoskeletal: Normal range of motion.  Neurological: She is alert.  Skin: Skin is warm.  Psychiatric: She has a normal mood and affect.     UC Treatments / Results  Labs (all labs ordered are listed, but only abnormal  results are displayed) Labs Reviewed  BASIC METABOLIC PANEL  CBC  TROPONIN I    EKG  EKG Interpretation None      ED ECG REPORT I, Cleone Hulick H, the attending physician, personally viewed and interpreted this ECG.   Date: 09/16/2016  EKG Time:11:40:05  Rate: 57  Abnormal EKG possible septal infarct age undetermined  Axis:   Intervals: ST&T Change: The hypertrophy with repolarization abnormalities left anterior fascicular block Radiology No results found.  Procedures Procedures (including critical care time)  Medications Ordered in UC Medications  ondansetron (ZOFRAN-ODT) disintegrating tablet 4 mg (not administered)  oxyCODONE-acetaminophen (PERCOCET/ROXICET) 5-325 MG per tablet 2 tablet (not administered)     Initial Impression / Assessment and Plan / UC Course  I have reviewed the triage vital signs and the nursing notes.  Pertinent labs & imaging results that were available during my care of the patient were reviewed by me and considered in my medical decision making (see chart for details).  Clinical Course     Patient given 4 baby aspirins here saline lock started O2 started as well EMS responders called the patient be transferred to [facility]. EMS medical records here as well as first binder and discussed with charge nurse Tammy SoursGreg about pending arrival  Final Clinical Impressions(s) / UC Diagnoses   Final diagnoses:  None    New Prescriptions New Prescriptions   No medications on file     Note: This dictation was prepared with Dragon dictation along with smaller phrase technology. Any transcriptional errors that result from this process are unintentional.   Hassan RowanEugene Cohl Behrens, MD 09/16/16 1344    Hassan RowanEugene Sary Bogie, MD 09/16/16 1349

## 2016-09-16 NOTE — ED Triage Notes (Signed)
Pt here from [facility] with right sided chest pain x3 days, pt with clear productive cough and pain in chest with cough and movement. EMS reports rhonchi in bilateral lungs.

## 2016-09-16 NOTE — ED Triage Notes (Signed)
Patient started having severe chest pain this am.

## 2016-09-16 NOTE — ED Provider Notes (Signed)
CT angio IMPRESSION: Coronary arteriosclerosis. No acute pulmonary embolus. Aortic atherosclerosis without aneurysm or dissection.  Bilateral subpleural interstitial fibrosis is again noted with scattered 4-5 mm nodular densities in the bilateral upper and right lower lobes with dominant 8.5 mm nodule unchanged since 2012 and consistent with a benign finding. As there is a different distribution to the 4-5 mm nodular densities within the lungs, consider follow up per Fleischner recommendations as described.  No follow-up needed if patient is low-risk (and has no known or suspected primary neoplasm). Non-contrast chest CT can be considered in 12 months if patient is high-risk. This recommendation follows the consensus statement: Guidelines for Management of Incidental Pulmonary Nodules Detected on CT Images: From the Fleischner Society 2017; Radiology 2017; 284:228-243.  Discussed the findings with the patient. Did discuss findings of nodules and did recommend repeat CT scan in about a year. Did discuss that this is some subjective follow-up with primary care. At this point no obvious concerning cause of the patient's pain. I discuss possibility of perhaps early shingles versus inflammation. Patient and family felt comfortable going home.   Tamara SemenGraydon Etan Vasudevan, MD 09/16/16 1807

## 2016-10-13 ENCOUNTER — Ambulatory Visit (INDEPENDENT_AMBULATORY_CARE_PROVIDER_SITE_OTHER): Payer: Medicare PPO

## 2016-10-13 ENCOUNTER — Ambulatory Visit
Admission: EM | Admit: 2016-10-13 | Discharge: 2016-10-13 | Disposition: A | Discharge status: Home / Self Care | Payer: Medicare PPO | Attending: Family Medicine | Admitting: Family Medicine

## 2016-10-13 DIAGNOSIS — S52201A Unspecified fracture of shaft of right ulna, initial encounter for closed fracture: Secondary | ICD-10-CM | POA: Diagnosis not present

## 2016-10-13 DIAGNOSIS — S5291XA Unspecified fracture of right forearm, initial encounter for closed fracture: Secondary | ICD-10-CM

## 2016-10-13 DIAGNOSIS — W19XXXA Unspecified fall, initial encounter: Secondary | ICD-10-CM

## 2016-10-13 NOTE — ED Provider Notes (Signed)
[facility]    CSN: [id number] Arrival date & time: 10/13/16  0957     History   Chief Complaint Chief Complaint  Patient presents with  . Fall    HPI Tamara Rich is a 83 y.o. female.   The history is provided by the patient. No language interpreter was used.  Fall  This is a new problem. The current episode started yesterday. The problem has not changed since onset.Pertinent negatives include no chest pain, no abdominal pain, no headaches and no shortness of breath. The symptoms are aggravated by twisting. Nothing relieves the symptoms. She has tried nothing for the symptoms. The treatment provided no relief.    Past Medical History:  Diagnosis Date  . CAD (coronary artery disease)   . CHF (congestive heart failure) (HCC)   . Depression   . GERD (gastroesophageal reflux disease)   . Heart murmur   . Hematuria   . Hyperlipemia   . Hypertension   . Osteoarthritis   . Pancreatitis   . Peripheral neuropathy (HCC)   . Prediabetes   . Pulmonary nodule   . Renal lesion     There are no active problems to display for this patient.   Past Surgical History:  Procedure Laterality Date  . ABDOMINAL SURGERY    . BACK SURGERY     x 3  . CARDIAC CATHETERIZATION    . CHOLECYSTECTOMY    . CORONARY ANGIOPLASTY    . edg    . ENDOSCOPIC RETROGRADE CHOLANGIOPANCREATOGRAPHY (ERCP) WITH PROPOFOL    . HIATAL HERNIA REPAIR    . HIP SURGERY Right   . TOTAL KNEE ARTHROPLASTY      OB History    No data available       Home Medications    Prior to Admission medications   Medication Sig Start Date End Date Taking? Authorizing Provider  acetaminophen (TYLENOL) 325 MG tablet Take 650 mg by mouth every 6 (six) hours as needed.   Yes Historical Provider, MD  albuterol (PROVENTIL HFA;VENTOLIN HFA) 108 (90 Base) MCG/ACT inhaler Inhale into the lungs every 6 (six) hours as needed for wheezing or shortness of breath.   Yes Historical Provider, MD  alendronate  (FOSAMAX) 70 MG tablet Take 70 mg by mouth once a week. Take with a full glass of water on an empty stomach.   Yes Historical Provider, MD  bisacodyl (DULCOLAX) 5 MG EC tablet Take 10 mg by mouth daily as needed for moderate constipation.   Yes Historical Provider, MD  FLUoxetine (PROZAC WEEKLY) 90 MG DR capsule Take 90 mg by mouth every 7 (seven) days.   Yes Historical Provider, MD  fluticasone (FLONASE) 50 MCG/ACT nasal spray Place into both nostrils daily.   Yes Historical Provider, MD  isosorbide dinitrate (ISORDIL) 30 MG tablet Take 30 mg by mouth 4 (four) times daily.   Yes Historical Provider, MD  Lido-Capsaicin-Men-Methyl Sal (MEDI-PATCH-LIDOCAINE) 0.5-0.035-5-20 % PTCH Apply topically.   Yes Historical Provider, MD  magnesium oxide (MAG-OX) 400 MG tablet Take 400 mg by mouth daily.   Yes Historical Provider, MD  nitroGLYCERIN (NITROSTAT) 0.4 MG SL tablet Place 0.4 mg under the tongue every 5 (five) minutes as needed for chest pain.   Yes Historical Provider, MD  ondansetron (ZOFRAN ODT) 8 MG disintegrating tablet Take 1 tablet (8 mg total) by mouth every 8 (eight) hours as needed for nausea or vomiting. 08/07/15  Yes Hassan RowanEugene Dominga Mcduffie, MD  pantoprazole (PROTONIX) 40 MG tablet Take 40 mg by  mouth daily.   Yes Historical Provider, MD  pravastatin (PRAVACHOL) 40 MG tablet Take 40 mg by mouth daily.   Yes Historical Provider, MD  predniSONE (STERAPRED UNI-PAK 21 TAB) 10 MG (21) TBPK tablet Sig 6 tablet day 1, 5 tablets day 2, 4 tablets day 3,,3tablets day 4, 2 tablets day 5, 1 tablet day 6 take all tablets orally 08/07/15  Yes Hassan Rowan, MD  sucralfate (CARAFATE) 1 g tablet Take 1 g by mouth 4 (four) times daily -  with meals and at bedtime.   Yes Historical Provider, MD  traMADol (ULTRAM) 50 MG tablet Take by mouth every 6 (six) hours as needed.   Yes Historical Provider, MD    Family History History reviewed. No pertinent family history.  Social History Social History  Substance Use Topics    . Smoking status: Former Smoker    Quit date: 01/22/2009  . Smokeless tobacco: Never Used  . Alcohol use No     Allergies   Codeine; Gabapentin; and Sulfa antibiotics   Review of Systems Review of Systems  Respiratory: Negative for shortness of breath.   Cardiovascular: Negative for chest pain.  Gastrointestinal: Negative for abdominal pain.  Musculoskeletal: Positive for joint swelling and myalgias.  Neurological: Negative for headaches.  All other systems reviewed and are negative.    Physical Exam Triage Vital Signs ED Triage Vitals  Enc Vitals Group     BP 10/13/16 1114 132/89     Pulse Rate 10/13/16 1114 66     Resp 10/13/16 1114 18     Temp 10/13/16 1114 97.9 F (36.6 C)     Temp Source 10/13/16 1114 Oral     SpO2 10/13/16 1114 97 %     Weight 10/13/16 1114 134 lb (60.8 kg)     Height 10/13/16 1114 5\' 6"  (1.676 m)     Head Circumference --      Peak Flow --      Pain Score 10/13/16 1117 10     Pain Loc --      Pain Edu? --      Excl. in GC? --    No data found.   Updated Vital Signs BP 132/89 (BP Location: Left Arm)   Pulse 66   Temp 97.9 F (36.6 C) (Oral)   Resp 18   Ht 5\' 6"  (1.676 m)   Wt 134 lb (60.8 kg)   SpO2 97%   BMI 21.63 kg/m   Visual Acuity Right Eye Distance:   Left Eye Distance:   Bilateral Distance:    Right Eye Near:   Left Eye Near:    Bilateral Near:     Physical Exam  Constitutional: She is oriented to person, place, and time. She appears well-developed and well-nourished.  Elderly  WF  HENT:  Head: Normocephalic.  Eyes: Pupils are equal, round, and reactive to light.  Neck: Normal range of motion. Neck supple.  Pulmonary/Chest: Effort normal.  Musculoskeletal: Normal range of motion. She exhibits tenderness.       Right wrist: She exhibits tenderness, swelling and deformity.       Arms: She has marked swelling and tenderness of both the size of the right wrist. She is tender over the radial side and the ulnar side  as well. There is some mild deformity of the wrist as well.  Neurological: She is alert and oriented to person, place, and time.  Skin: Skin is warm.  Vitals reviewed.    UC Treatments / Results  Labs (all labs ordered are listed, but only abnormal results are displayed) Labs Reviewed - No data to display  EKG  EKG Interpretation None       Radiology Dg Wrist Complete Right  Result Date: 10/13/2016 CLINICAL DATA:  Larey Seat yesterday.  Pain of the wrist. EXAM: RIGHT WRIST - COMPLETE 3+ VIEW COMPARISON:  05/22/2013 FINDINGS: Slightly impacted callus fracture of the distal radius with slight dorsal tilt of the distal radial articular surface. Chronic chondrocalcinosis of the triangular fibrocartilage. Question fracture of the tip of the ulnar styloid. No acute fracture in the carpal region. Chronic degenerative changes at the first carpal/metacarpal joint. No evidence of acute metacarpal fracture. IMPRESSION: Colles fracture of the distal radius. Slight dorsal tilt of the distal radial articular surface. Minimal fracture tip of the ulnar styloid. Electronically Signed   By: Paulina Fusi M.D.   On: 10/13/2016 11:38    Procedures Procedures (including critical care time)  Medications Ordered in UC Medications - No data to display   Initial Impression / Assessment and Plan / UC Course  I have reviewed the triage vital signs and the nursing notes.  Pertinent labs & imaging results that were available during my care of the patient were reviewed by me and considered in my medical decision making (see chart for details).   patient is a cold is fracture of the distal radius and also minimal fracture tip of the ulnar styloid. We'll place her in a sling sugar tong splint and GERD see the orthopedic as soon as possible for further evaluation. Explained to her daughter that were not putting a cast on and that this is beyond what we can provide here she needs to see a specialist in orthopedics for  further evaluation and treatment  Final Clinical Impressions(s) / UC Diagnoses   Final diagnoses:  Radius/ulna fracture, right, closed, initial encounter  Fall, initial encounter    New Prescriptions New Prescriptions   No medications on file   Note: This dictation was prepared with Dragon dictation along with smaller phrase technology. Any transcriptional errors that result from this process are unintentional.   Hassan Rowan, MD 10/13/16 1218

## 2016-10-13 NOTE — ED Triage Notes (Signed)
Pt fell last night in the kitchen and hit her right wrist.

## 2021-05-16 DEATH — deceased
